# Patient Record
Sex: Female | Born: 1980 | Race: Black or African American | Hispanic: No | Marital: Single | Smoking: Current some day smoker
Health system: Southern US, Community
[De-identification: ages and names within clinical notes are randomized; demographics above are authoritative.]

## PROBLEM LIST (undated history)

## (undated) DIAGNOSIS — D649 Anemia, unspecified: Secondary | ICD-10-CM

## (undated) DIAGNOSIS — D259 Leiomyoma of uterus, unspecified: Secondary | ICD-10-CM

## (undated) DIAGNOSIS — F419 Anxiety disorder, unspecified: Secondary | ICD-10-CM

## (undated) DIAGNOSIS — Z789 Other specified health status: Secondary | ICD-10-CM

## (undated) DIAGNOSIS — F32A Depression, unspecified: Secondary | ICD-10-CM

## (undated) DIAGNOSIS — E7800 Pure hypercholesterolemia, unspecified: Secondary | ICD-10-CM

## (undated) HISTORY — PX: TOOTH EXTRACTION: SUR596

## (undated) HISTORY — DX: Depression, unspecified: F32.A

## (undated) HISTORY — PX: INDUCED ABORTION: SHX677

## (undated) HISTORY — PX: DILATION AND CURETTAGE OF UTERUS: SHX78

## (undated) HISTORY — DX: Anxiety disorder, unspecified: F41.9

## (undated) HISTORY — PX: TUBAL LIGATION: SHX77

## (undated) HISTORY — DX: Pure hypercholesterolemia, unspecified: E78.00

## (undated) HISTORY — DX: Leiomyoma of uterus, unspecified: D25.9

---

## 1998-03-26 ENCOUNTER — Emergency Department (HOSPITAL_COMMUNITY): Admission: EM | Admit: 1998-03-26 | Discharge: 1998-03-26 | Payer: Self-pay | Admitting: Emergency Medicine

## 1998-06-15 ENCOUNTER — Emergency Department (HOSPITAL_COMMUNITY): Admission: EM | Admit: 1998-06-15 | Discharge: 1998-06-15 | Payer: Self-pay | Admitting: Internal Medicine

## 1999-02-08 ENCOUNTER — Other Ambulatory Visit: Admission: RE | Admit: 1999-02-08 | Discharge: 1999-02-08 | Payer: Self-pay | Admitting: *Deleted

## 1999-09-07 ENCOUNTER — Emergency Department (HOSPITAL_COMMUNITY): Admission: EM | Admit: 1999-09-07 | Discharge: 1999-09-07 | Payer: Self-pay | Admitting: Emergency Medicine

## 2001-07-02 ENCOUNTER — Emergency Department (HOSPITAL_COMMUNITY): Admission: EM | Admit: 2001-07-02 | Discharge: 2001-07-02 | Payer: Self-pay | Admitting: Emergency Medicine

## 2002-06-05 ENCOUNTER — Emergency Department (HOSPITAL_COMMUNITY): Admission: EM | Admit: 2002-06-05 | Discharge: 2002-06-05 | Payer: Self-pay | Admitting: Emergency Medicine

## 2002-12-15 ENCOUNTER — Emergency Department (HOSPITAL_COMMUNITY): Admission: EM | Admit: 2002-12-15 | Discharge: 2002-12-15 | Payer: Self-pay | Admitting: Emergency Medicine

## 2005-03-17 ENCOUNTER — Emergency Department (HOSPITAL_COMMUNITY): Admission: EM | Admit: 2005-03-17 | Discharge: 2005-03-17 | Payer: Self-pay | Admitting: Family Medicine

## 2005-04-15 ENCOUNTER — Emergency Department (HOSPITAL_COMMUNITY): Admission: EM | Admit: 2005-04-15 | Discharge: 2005-04-15 | Payer: Self-pay | Admitting: Emergency Medicine

## 2005-06-28 ENCOUNTER — Emergency Department (HOSPITAL_COMMUNITY): Admission: EM | Admit: 2005-06-28 | Discharge: 2005-06-28 | Payer: Self-pay | Admitting: Emergency Medicine

## 2005-12-24 ENCOUNTER — Emergency Department (HOSPITAL_COMMUNITY): Admission: EM | Admit: 2005-12-24 | Discharge: 2005-12-24 | Payer: Self-pay | Admitting: Emergency Medicine

## 2005-12-25 ENCOUNTER — Emergency Department (HOSPITAL_COMMUNITY): Admission: EM | Admit: 2005-12-25 | Discharge: 2005-12-25 | Payer: Self-pay | Admitting: Emergency Medicine

## 2006-02-02 ENCOUNTER — Ambulatory Visit: Payer: Self-pay | Admitting: Family Medicine

## 2006-02-09 ENCOUNTER — Ambulatory Visit: Payer: Self-pay | Admitting: Family Medicine

## 2006-02-09 ENCOUNTER — Other Ambulatory Visit: Admission: RE | Admit: 2006-02-09 | Discharge: 2006-02-09 | Payer: Self-pay | Admitting: Family Medicine

## 2006-02-09 ENCOUNTER — Encounter (INDEPENDENT_AMBULATORY_CARE_PROVIDER_SITE_OTHER): Payer: Self-pay | Admitting: Specialist

## 2006-03-09 ENCOUNTER — Ambulatory Visit: Payer: Self-pay | Admitting: Obstetrics & Gynecology

## 2006-04-25 ENCOUNTER — Emergency Department (HOSPITAL_COMMUNITY): Admission: EM | Admit: 2006-04-25 | Discharge: 2006-04-25 | Payer: Self-pay | Admitting: Family Medicine

## 2007-02-23 ENCOUNTER — Inpatient Hospital Stay (HOSPITAL_COMMUNITY): Admission: AD | Admit: 2007-02-23 | Discharge: 2007-02-23 | Payer: Self-pay | Admitting: Obstetrics

## 2007-02-27 ENCOUNTER — Encounter: Payer: Self-pay | Admitting: Obstetrics

## 2007-02-27 ENCOUNTER — Ambulatory Visit (HOSPITAL_COMMUNITY): Admission: AD | Admit: 2007-02-27 | Discharge: 2007-02-27 | Payer: Self-pay | Admitting: Obstetrics

## 2007-06-20 ENCOUNTER — Emergency Department (HOSPITAL_COMMUNITY): Admission: EM | Admit: 2007-06-20 | Discharge: 2007-06-20 | Payer: Self-pay | Admitting: Emergency Medicine

## 2008-03-12 ENCOUNTER — Emergency Department (HOSPITAL_COMMUNITY): Admission: EM | Admit: 2008-03-12 | Discharge: 2008-03-12 | Payer: Self-pay | Admitting: Emergency Medicine

## 2009-12-16 ENCOUNTER — Inpatient Hospital Stay (HOSPITAL_COMMUNITY): Admission: AD | Admit: 2009-12-16 | Discharge: 2009-12-18 | Payer: Self-pay | Admitting: Obstetrics and Gynecology

## 2010-01-26 ENCOUNTER — Ambulatory Visit (HOSPITAL_COMMUNITY): Admission: RE | Admit: 2010-01-26 | Discharge: 2010-01-26 | Payer: Self-pay | Admitting: Obstetrics and Gynecology

## 2010-12-21 LAB — CBC
Hemoglobin: 11.7 g/dL — ABNORMAL LOW (ref 12.0–15.0)
MCV: 91.8 fL (ref 78.0–100.0)
Platelets: 245 10*3/uL (ref 150–400)
WBC: 5 10*3/uL (ref 4.0–10.5)

## 2010-12-26 LAB — CBC
HCT: 27.7 % — ABNORMAL LOW (ref 36.0–46.0)
HCT: 30 % — ABNORMAL LOW (ref 36.0–46.0)
Hemoglobin: 10 g/dL — ABNORMAL LOW (ref 12.0–15.0)
MCHC: 33.5 g/dL (ref 30.0–36.0)
MCV: 91.4 fL (ref 78.0–100.0)
MCV: 91.9 fL (ref 78.0–100.0)
Platelets: 261 10*3/uL (ref 150–400)
RBC: 3.28 MIL/uL — ABNORMAL LOW (ref 3.87–5.11)
RDW: 14.7 % (ref 11.5–15.5)
WBC: 12.1 10*3/uL — ABNORMAL HIGH (ref 4.0–10.5)
WBC: 13.5 10*3/uL — ABNORMAL HIGH (ref 4.0–10.5)

## 2010-12-26 LAB — RPR: RPR Ser Ql: NONREACTIVE

## 2011-01-03 ENCOUNTER — Inpatient Hospital Stay (HOSPITAL_COMMUNITY)
Admission: AD | Admit: 2011-01-03 | Discharge: 2011-01-03 | Disposition: A | Discharge status: Home / Self Care | Payer: Medicaid Other | Source: Ambulatory Visit | Attending: Obstetrics and Gynecology | Admitting: Obstetrics and Gynecology

## 2011-01-03 DIAGNOSIS — N949 Unspecified condition associated with female genital organs and menstrual cycle: Secondary | ICD-10-CM | POA: Insufficient documentation

## 2011-01-03 DIAGNOSIS — N841 Polyp of cervix uteri: Secondary | ICD-10-CM | POA: Insufficient documentation

## 2011-01-03 DIAGNOSIS — N938 Other specified abnormal uterine and vaginal bleeding: Secondary | ICD-10-CM | POA: Insufficient documentation

## 2011-01-03 LAB — WET PREP, GENITAL: Yeast Wet Prep HPF POC: NONE SEEN

## 2011-01-03 LAB — POCT PREGNANCY, URINE: Preg Test, Ur: NEGATIVE

## 2011-01-13 ENCOUNTER — Encounter (INDEPENDENT_AMBULATORY_CARE_PROVIDER_SITE_OTHER): Payer: Medicaid Other | Admitting: Obstetrics & Gynecology

## 2011-01-13 ENCOUNTER — Other Ambulatory Visit: Payer: Self-pay | Admitting: Obstetrics and Gynecology

## 2011-01-13 DIAGNOSIS — N86 Erosion and ectropion of cervix uteri: Secondary | ICD-10-CM

## 2011-01-13 DIAGNOSIS — Z124 Encounter for screening for malignant neoplasm of cervix: Secondary | ICD-10-CM

## 2011-01-13 DIAGNOSIS — Z01419 Encounter for gynecological examination (general) (routine) without abnormal findings: Secondary | ICD-10-CM

## 2011-01-14 NOTE — Progress Notes (Signed)
NAME:  Kathleen Levy, Kathleen Levy NO.:  [id number]  MEDICAL RECORD NO.:  [id number]           PATIENT TYPE:  A  LOCATION:  [facility]                   FACILITY:  [facility]  PHYSICIAN:  Argentina Donovan, MD        DATE OF BIRTH:  11/22/1980  DATE OF SERVICE:  01/13/2011                                 CLINIC NOTE  The patient is a 30 year old African American female, gravida 4, para 2- 0-2-2, who had been quite some time without having had sex.  She had a recent companion and began spotting and bleeding after sex.  She went into the emergency room and they told her she has a small cervical polyp and referred her to here.  On examination, the patient had a Pap in a year.  We did a Pap, which she had a slight cervical ectropion that almost look like a polyp, which we treated with silver nitrate quite easily and I think that will take care of her problem.  The external genitalia is normal.  BUS within normal limits.  Vagina is clean and well rugated.  The cervix is clean and parous.  The uterus is anterior, normal size, shape, and consistency.  The adnexa is normal.  IMPRESSION:  Normal gynecological examination with small ectropion, treated with silver nitrate.          ______________________________ Argentina Donovan, MD    PR/MEDQ  D:  01/13/2011  T:  01/14/2011  Job:  [id number]

## 2011-02-15 NOTE — Op Note (Signed)
Kathleen Levy, Kathleen Levy NO.:  [id number]   MEDICAL RECORD NO.:  [id number]          PATIENT TYPE:  MAT   LOCATION:  [facility]                          [facility]   PHYSICIAN:  Charles A. Clearance Coots, M.D.DATE OF BIRTH:  July 13, 1981   DATE OF PROCEDURE:  DATE OF DISCHARGE:                               OPERATIVE REPORT   PREOPERATIVE DIAGNOSIS:  Incomplete spontaneous abortion first  trimester.   POSTOPERATIVE DIAGNOSIS:  Incomplete spontaneous abortion first  trimester.   PROCEDURE:  Suction dilation and evacuation.   SURGEON:  Dr. Coral Ceo.   ANESTHESIA:  MAC with paracervical block.   ESTIMATED BLOOD LOSS:  100 mL.   COMPLICATIONS:  None.   SPECIMEN:  Products of conception.   OPERATION:  The patient was brought to operating room, and after  satisfactory IV sedation, the legs were brought up in stirrups and the  vagina was prepped and draped in usual sterile fashion.  The urinary  bladder was emptied of approximately 50 mL of clear urine.  Bimanual  examination revealed uterus to be retroverted, about eight week size.  Cervix was opened with products of conception in the cervical os.  Sterile speculum was inserted in the vaginal vault and cervix was  isolated.  The anterior lip of the cervix was grasped with a single  tooth tenaculum.  Paracervical block of 10 mL of 1% lidocaine with a  1:10 dilution of sodium bicarbonate, 10 mL was injected in each lateral  fornix.  The uterus was sounded for its axis, and the cervix is dilated  to approximately 27 mm with the [facility] dilator. A #8 suction catheter was  easily introduced into the uterine cavity, and all contents were  removed.  The endometrial surface felt gritty at the conclusion of the  procedure, uterus contracted down quite well.  There was no active  bleeding.  At the conclusion of the procedure, products of conception  were submitted to pathology for evaluation.  The patient tolerated the  procedure well and was transported to recovery room in satisfactory  condition.      Charles A. Clearance Coots, M.D.  Electronically Signed     CAH/MEDQ  D:  02/27/2007  T:  02/27/2007  Job:  [id number]

## 2011-02-18 NOTE — Group Therapy Note (Signed)
[name], Kathleen Levy NO.:  [id number]   MEDICAL RECORD NO.:  [id number]          PATIENT TYPE:  WOC   LOCATION:  [facility]                   FACILITY:  [facility]   PHYSICIAN:  Tinnie Gens, MD        DATE OF BIRTH:  12/16/1980   DATE OF SERVICE:  02/02/2006                                    CLINIC NOTE   CHIEF COMPLAINT:  Abnormal Pap.   HISTORY OF PRESENT ILLNESS:  The patient is a 30 year old, gravida 2, para 1-  0-1-1 who [facility] that showed  ASC-H.  After that she underwent a colposcopy, which showed mild  koilocytotic atypia and failed to show a complete agreement with her Pap  smear.  Additionally, the patient is status post cryo in 1997 and had an  unsatisfactory Pap smear.   PAST MEDICAL HISTORY:  Significant for a heart murmur.   PAST SURGICAL HISTORY:  Negative.   MEDICATIONS:  Vicodin as needed secondary to having a tooth pulled.   ALLERGIES:  None known.   OB HISTORY:  G 2, P 1, one termination, one living child by vaginal  delivery.   GYN HISTORY:  Menarche at age 21.  Cycles every 28 days, lasts 4 days, medium  flow, moderate pain.  The patient is not on any birth control.  She is in a  relationship with a female partner.   FAMILY HISTORY:  Significant for diabetes and hypertension.   SOCIAL HISTORY:  She lives with her daughter.  She does work.  She drinks  alcohol socially.  No other drug use.  She does not smoke.   A 14-point review of systems was reviewed and negative.  Please see GYN  history in the chart.   PHYSICAL EXAMINATION:  VITAL SIGNS:  Her vitals are on the chart.  GENERAL APPEARANCE:  She is a well-developed, well-nourished African-  American female, in no acute distress.  ABDOMEN:  Soft, nontender, nondistended.   IMPRESSION:  1.  Abnormal Pap.  2.  A history of cryo.  3.  Unsatisfactory colposcopy.   PLAN:  __________  She is scheduled back on May 10 for this.     ______________________________  Tinnie Gens, MD     TP/MEDQ  D:  02/02/2006  T:  02/03/2006  Job:  [id number]

## 2011-02-18 NOTE — Group Therapy Note (Signed)
NAME:  Kathleen Levy, Kathleen Levy NO.:  [id number]   MEDICAL RECORD NO.:  [id number]          PATIENT TYPE:  WOC   LOCATION:  [facility]                   FACILITY:  [facility]   PHYSICIAN:  Tinnie Gens, MD        DATE OF BIRTH:  October 06, 1980   DATE OF SERVICE:                                    CLINIC NOTE   CHIEF COMPLAINT:  Annual Pap smear.   HISTORY:  This patient is a 30 year old female who has had previous cryo who  had ASC-H and then a benign colpo biopsy with an unsatisfactory colpo who is  referred for LEEP procedure.   PROCEDURE:  A Teflon __________ was placed inside the vagina.  A colposcopy  was performed.  There was acetowhite area around a very small transformation  zone.  Edges of the lesions could not be seen.  Lidocaine 1% 10 mL with  epinephrine was then injected for a paracervical block, and a small  __________ cone was used to perform the LEEP.  The LEEP was cauterized with  electrocautery.  Hemostasis was achieved with Monsell's solution solution.  The patient tolerated the procedure well.  She will follow up in two weeks  for a review of pathology.           ______________________________  Tinnie Gens, MD     TP/MEDQ  D:  02/09/2006  T:  02/10/2006  Job:  [id number]

## 2011-02-18 NOTE — Group Therapy Note (Signed)
NAME:  Kathleen Levy, Kathleen Levy NO.:  [id number]   MEDICAL RECORD NO.:  [id number]          PATIENT TYPE:  WOC   LOCATION:  [facility]                   [facility]ACILITY:  [facility]   PHYSICIAN:  Dorthula Perfect, MD     DATE OF BIRTH:  Aug 01, 1981   DATE OF SERVICE:  03/09/2006                                    CLINIC NOTE   HISTORY OF PRESENT ILLNESS:  This 30 year old African American female had a  LEEP procedure done Feb 10, 2006 because of an abnormal Pap smear.  She had  directed colposcopy followed by biopsy.  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  The margins were free of dysplasia.   Her last menstrual period was the end of May.  She is having no vaginal  bleeding.  She has a slight amount of vaginal discharge, but it is lessening  every day or so.   PHYSICAL EXAMINATION:  PELVIC:  Her external genitalia and BUS glands were  normal.  Vaginal vault was epithelialized.  Inspection of the cervix  revealed a healing cervix post-LEEP.  There was a minute bleeding site at  the 6 o'clock position which was treated with silver nitrate.  The uterus  was normal.   IMPRESSION:  Status post CIN1 loop electrosurgical excision procedure.   DISPOSITION:  The patient is asked to refrain from any sexual intercourse  for the next month or so.  She states she is not having intercourse at this  time.  She is encouraged to have another Pap smear performed in four months.  She has no questions.           ______________________________  Dorthula Perfect, MD     ER/MEDQ  D:  03/09/2006  T:  03/10/2006  Job:  [id number]

## 2011-03-28 ENCOUNTER — Inpatient Hospital Stay (INDEPENDENT_AMBULATORY_CARE_PROVIDER_SITE_OTHER)
Admission: RE | Admit: 2011-03-28 | Discharge: 2011-03-28 | Disposition: A | Discharge status: Home / Self Care | Payer: Medicaid Other | Source: Ambulatory Visit | Attending: Family Medicine | Admitting: Family Medicine

## 2011-03-28 DIAGNOSIS — M545 Low back pain: Secondary | ICD-10-CM

## 2011-03-28 LAB — POCT URINALYSIS DIP (DEVICE)
Glucose, UA: NEGATIVE mg/dL
Leukocytes, UA: NEGATIVE
Urobilinogen, UA: 0.2 mg/dL (ref 0.0–1.0)

## 2011-11-03 ENCOUNTER — Encounter (HOSPITAL_COMMUNITY): Payer: Self-pay | Admitting: *Deleted

## 2011-11-03 ENCOUNTER — Inpatient Hospital Stay (HOSPITAL_COMMUNITY)
Admission: AD | Admit: 2011-11-03 | Discharge: 2011-11-04 | Disposition: A | Discharge status: Home / Self Care | Payer: Medicaid Other | Source: Ambulatory Visit | Attending: Obstetrics and Gynecology | Admitting: Obstetrics and Gynecology

## 2011-11-03 ENCOUNTER — Emergency Department (HOSPITAL_COMMUNITY)
Admission: EM | Admit: 2011-11-03 | Discharge: 2011-11-03 | Disposition: A | Discharge status: Home / Self Care | Payer: Medicaid Other | Source: Home / Self Care | Attending: Emergency Medicine | Admitting: Emergency Medicine

## 2011-11-03 DIAGNOSIS — J029 Acute pharyngitis, unspecified: Secondary | ICD-10-CM

## 2011-11-03 DIAGNOSIS — H9209 Otalgia, unspecified ear: Secondary | ICD-10-CM | POA: Insufficient documentation

## 2011-11-03 DIAGNOSIS — A499 Bacterial infection, unspecified: Secondary | ICD-10-CM | POA: Insufficient documentation

## 2011-11-03 DIAGNOSIS — N949 Unspecified condition associated with female genital organs and menstrual cycle: Secondary | ICD-10-CM | POA: Insufficient documentation

## 2011-11-03 DIAGNOSIS — B9689 Other specified bacterial agents as the cause of diseases classified elsewhere: Secondary | ICD-10-CM | POA: Insufficient documentation

## 2011-11-03 DIAGNOSIS — N760 Acute vaginitis: Secondary | ICD-10-CM | POA: Insufficient documentation

## 2011-11-03 HISTORY — DX: Other specified health status: Z78.9

## 2011-11-03 LAB — URINALYSIS, ROUTINE W REFLEX MICROSCOPIC
Bilirubin Urine: NEGATIVE
Glucose, UA: NEGATIVE mg/dL
Nitrite: NEGATIVE
Specific Gravity, Urine: >1.03 — ABNORMAL HIGH (ref 1.005–1.030)
pH: 5.5 (ref 5.0–8.0)

## 2011-11-03 LAB — WET PREP, GENITAL
Trich, Wet Prep: NONE SEEN
Yeast Wet Prep HPF POC: NONE SEEN

## 2011-11-03 LAB — URINE MICROSCOPIC-ADD ON

## 2011-11-03 MED ORDER — IBUPROFEN 800 MG PO TABS
800.0000 mg | ORAL_TABLET | Freq: Once | ORAL | Status: AC
  Administered 2011-11-03: 800 mg via ORAL
  Filled 2011-11-03: qty 1

## 2011-11-03 NOTE — ED Provider Notes (Signed)
History     CSN: [id number]  Arrival date & time 11/03/11  4098   First MD Initiated Contact with Patient 11/03/11 1946      Chief Complaint  Patient presents with  . Otalgia    with sore throat    (Consider location/radiation/quality/duration/timing/severity/associated sxs/prior treatment) HPI  Patient presents to ER complaining of a 24 hour hx of gradual onset of sore throat and right ear pain that began yesterday and persisted without change throughout the day today. Patient has not taken anything for pain PTA. 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 Denies fevers, chills, HA, stiffneck, rash, cough, CP, SOB, n/v. Patient has no known medical problems and takes no meds on regular basis. She states swallowing increases pain. Denies alleviating factors. Denies changes in hearing.   History reviewed. No pertinent past medical history.  Past Surgical History  Procedure Date  . Tubal ligation     History reviewed. No pertinent family history.  History  Substance Use Topics  . Smoking status: Not on file  . Smokeless tobacco: Not on file  . Alcohol Use: No    OB History    Grav Para Term Preterm Abortions TAB SAB Ect Mult Living                  Review of Systems  All other systems reviewed and are negative.    Allergies  Review of patient's allergies indicates no known allergies.  Home Medications  No current outpatient prescriptions on file.  BP 113/89  Pulse 74  Temp(Src) 98.3 F (36.8 C) (Oral)  Resp 18  SpO2 99%  Physical Exam  Vitals reviewed. Constitutional: She is oriented to person, place, and time. She appears well-developed and well-nourished. No distress.  HENT:  Head: Normocephalic and atraumatic.  Right Ear: Tympanic membrane and external ear normal.  Left Ear: Tympanic membrane and external ear normal.  Nose: Nose normal.  Mouth/Throat: No oropharyngeal exudate.       Mild erythema of posterior pharynx and tonsils no tonsillar exudate  or enlargement. Patent airway. Swallowing secretions well  Drainage of posterior pharynx.   Eyes: Conjunctivae and EOM are normal. Pupils are equal, round, and reactive to light.  Neck: Normal range of motion. Neck supple.  Cardiovascular: Normal rate, regular rhythm and normal heart sounds.  Exam reveals no gallop and no friction rub.   No murmur heard. Pulmonary/Chest: Effort normal and breath sounds normal. No respiratory distress. She has no wheezes. She has no rales. She exhibits no tenderness.  Abdominal: Soft. She exhibits no distension and no mass. There is no tenderness. There is no rebound and no guarding.  Lymphadenopathy:    She has no cervical adenopathy.  Neurological: She is alert and oriented to person, place, and time. She has normal reflexes.  Skin: Skin is warm and dry. No rash noted. She is not diaphoretic.  Psychiatric: She has a normal mood and affect.    ED Course  Procedures (including critical care time)  PO ibuprofen.   Labs Reviewed - No data to display No results found.   1. Pharyngitis       MDM  Afebrile, non toxic appearing, swallowing secretions well. Patent airway. No signs or symptoms of tonsilar abscess. Normal TM's bilaterally.         Jenness Corner, [place] 11/03/11 [phone]

## 2011-11-03 NOTE — Progress Notes (Signed)
Pt having lower abd pain and yellow discharge x 1 wk.  Hx BAV.  LMP 1/21

## 2011-11-03 NOTE — ED Notes (Signed)
S. Lineberry NP at the bedside. 

## 2011-11-03 NOTE — ED Notes (Signed)
Pt started having a sore throat and right ear ache yesterday.

## 2011-11-03 NOTE — ED Provider Notes (Signed)
Medical screening examination/treatment/procedure(s) were performed by non-physician practitioner and as supervising physician I was immediately available for consultation/collaboration.  Arrin Ishler P Lockie Bothun, MD 11/03/11 2331 

## 2011-11-03 NOTE — ED Provider Notes (Signed)
History     Chief Complaint  Patient presents with  . Vaginal Discharge  . Abdominal Pain   HPI  Pt is not pregnant and complains of vaginal discharge with mild pelvic pain.  Pt has a history of bacterial vaginosis No past medical history on file.  Past Surgical History  Procedure Date  . Tubal ligation     No family history on file.  History  Substance Use Topics  . Smoking status: Not on file  . Smokeless tobacco: Not on file  . Alcohol Use: No    Allergies: No Known Allergies  No prescriptions prior to admission    Review of Systems  Constitutional: Negative for fever and chills.  Gastrointestinal: Positive for abdominal pain. Negative for nausea, vomiting, diarrhea and constipation.  Genitourinary: Negative for dysuria.  Skin: Negative for rash.   Physical Exam   Blood pressure 114/79, temperature 98.1 F (36.7 C), temperature source Oral, resp. rate 16, height 5\' 3"  (1.6 m), weight 202 lb (91.627 kg), last menstrual period 10/24/2011, SpO2 98.00%.  Physical Exam  Constitutional: She is oriented to person, place, and time. She appears well-developed and well-nourished.  HENT:  Head: Normocephalic.  Eyes: Pupils are equal, round, and reactive to light.  Neck: Normal range of motion. Neck supple.  Cardiovascular: Normal rate.   Respiratory: Effort normal.  GI: Soft. She exhibits no distension. There is no tenderness. There is no rebound and no guarding.  Genitourinary:       Small amount of white vaginal discharge in vault; cervix clean nontender uterus NSSC nontender adnexal without palpable enlargement or tenderness  Musculoskeletal: Normal range of motion.  Neurological: She is alert and oriented to person, place, and time.  Skin: Skin is warm and dry.  Psychiatric: She has a normal mood and affect.    MAU Course  Procedures Results for orders placed during the hospital encounter of 11/03/11 (from the past 24 hour(s))  URINALYSIS, ROUTINE W REFLEX  MICROSCOPIC     Status: Abnormal   Collection Time   11/03/11  8:45 PM      Component Value Range   Color, Urine YELLOW  YELLOW    APPearance CLEAR  CLEAR    Specific Gravity, Urine >1.030 (*) 1.005 - 1.030    pH 5.5  5.0 - 8.0    Glucose, UA NEGATIVE  NEGATIVE (mg/dL)   Hgb urine dipstick SMALL (*) NEGATIVE    Bilirubin Urine NEGATIVE  NEGATIVE    Ketones, ur NEGATIVE  NEGATIVE (mg/dL)   Protein, ur NEGATIVE  NEGATIVE (mg/dL)   Urobilinogen, UA 0.2  0.0 - 1.0 (mg/dL)   Nitrite NEGATIVE  NEGATIVE    Leukocytes, UA NEGATIVE  NEGATIVE   URINE MICROSCOPIC-ADD ON     Status: Abnormal   Collection Time   11/03/11  8:45 PM      Component Value Range   Squamous Epithelial / LPF RARE  RARE    WBC, UA 0-2  <3 (WBC/hpf)   RBC / HPF 3-6  <3 (RBC/hpf)   Bacteria, UA FEW (*) RARE   WET PREP, GENITAL     Status: Abnormal   Collection Time   11/03/11 11:15 PM      Component Value Range   Yeast Wet Prep HPF POC NONE SEEN  NONE SEEN    Trich, Wet Prep NONE SEEN  NONE SEEN    Clue Cells Wet Prep HPF POC FEW (*) NONE SEEN    WBC, Wet Prep HPF POC FEW (*)  NONE SEEN     Assessment and Plan  Bacterial vaginosis Prescription for Flagyl 500 mg BID for 7 days  Anneka Studer 11/03/2011, 11:03 PM

## 2011-11-04 LAB — GC/CHLAMYDIA PROBE AMP, GENITAL
Chlamydia, DNA Probe: NEGATIVE
GC Probe Amp, Genital: NEGATIVE

## 2011-11-04 MED ORDER — METRONIDAZOLE 500 MG PO TABS: 500.0000 mg | ORAL_TABLET | Freq: Two times a day (BID) | ORAL | Status: AC

## 2011-11-04 NOTE — ED Provider Notes (Signed)
Agree with above note.  Kathleen Levy 11/04/2011 5:50 AM

## 2012-04-24 ENCOUNTER — Encounter (HOSPITAL_COMMUNITY): Payer: Self-pay

## 2012-04-24 ENCOUNTER — Emergency Department (INDEPENDENT_AMBULATORY_CARE_PROVIDER_SITE_OTHER)
Admission: EM | Admit: 2012-04-24 | Discharge: 2012-04-24 | Disposition: A | Discharge status: Home / Self Care | Payer: Self-pay | Source: Home / Self Care

## 2012-04-24 DIAGNOSIS — B372 Candidiasis of skin and nail: Secondary | ICD-10-CM

## 2012-04-24 DIAGNOSIS — W57XXXA Bitten or stung by nonvenomous insect and other nonvenomous arthropods, initial encounter: Secondary | ICD-10-CM

## 2012-04-24 DIAGNOSIS — R202 Paresthesia of skin: Secondary | ICD-10-CM

## 2012-04-24 MED ORDER — CLOTRIMAZOLE 1 % EX CREA: TOPICAL_CREAM | CUTANEOUS | Status: AC

## 2012-04-24 MED ORDER — IBUPROFEN 800 MG PO TABS: 800.0000 mg | ORAL_TABLET | Freq: Three times a day (TID) | ORAL | Status: AC

## 2012-04-24 NOTE — ED Notes (Signed)
Patient states she may have been bitten by something on her left hand yesterday; c/o pain and swelling

## 2012-04-24 NOTE — ED Provider Notes (Signed)
History     CSN: [id number]  Arrival date & time 04/24/12  1849   None     Chief Complaint  Patient presents with  . Insect Bite    (Consider location/radiation/quality/duration/timing/severity/associated sxs/prior treatment) The history is provided by the patient.   This patient complains of a pruritic rash.  Location: bilateral breast folds Onset: 2 wk ago   Course: somewhat impoved Self-treated with: OTc powder             Improvement with treatment: yes  History Itching: yes  Tenderness: no  New medications/antibiotics: no  Pet exposure: no  Recent travel or tropical exposure: no  New soaps, shampoos, detergent, clothing: no Tick/insect exposure: no   Red Flags Feeling ill: no Fever:no Facial/tongue swelling/difficulty breathing:  no  Diabetic or immunocompromised: no Reports occurs each summer, also c/o left 3rd finger swelling after unknown insect bite to left dorsal hand associated with intermittent tingling.  No change in hand function or neurovascular status.  Past Medical History  Diagnosis Date  . No pertinent past medical history     Past Surgical History  Procedure Date  . Tubal ligation     History reviewed. No pertinent family history.  History  Substance Use Topics  . Smoking status: Never Smoker   . Smokeless tobacco: Not on file  . Alcohol Use: No    OB History    Grav Para Term Preterm Abortions TAB SAB Ect Mult Living   4 2 2  2 1 1   2       Review of Systems  Allergies  Review of patient's allergies indicates no known allergies.  Home Medications   Current Outpatient Rx  Name Route Sig Dispense Refill  . CLOTRIMAZOLE 1 % EX CREA  Apply to affected area 2 times daily 15 g 0  . DIPHENHYDRAMINE HCL 25 MG PO TABS Oral Take 25 mg by mouth every 6 (six) hours as needed. Patient only takes this as needed no set schedule    . IBUPROFEN 800 MG PO TABS Oral Take 1 tablet (800 mg total) by mouth 3 (three) times daily. 21 tablet 0      BP 103/68  Pulse 81  Temp 99 F (37.2 C) (Oral)  Resp 16  SpO2 97%  Physical Exam  Nursing note and vitals reviewed. Constitutional: She is oriented to person, place, and time. Vital signs are normal. She appears well-developed and well-nourished. She is active and cooperative.  HENT:  Head: Normocephalic.  Eyes: Conjunctivae are normal. Pupils are equal, round, and reactive to light. No scleral icterus.  Neck: Trachea normal. Neck supple.  Cardiovascular: Normal rate and regular rhythm.   Pulmonary/Chest: Effort normal and breath sounds normal.  Musculoskeletal:       Left shoulder: Normal.       Left elbow: Normal.       Left wrist: Normal.       Cervical back: Normal.       Left hand: Normal. She exhibits normal range of motion, no tenderness, normal two-point discrimination, normal capillary refill and no swelling. normal sensation noted. Normal strength noted.  Neurological: She is alert and oriented to person, place, and time. She has normal strength. No cranial nerve deficit or sensory deficit. GCS eye subscore is 4. GCS verbal subscore is 5. GCS motor subscore is 6.  Skin: Skin is warm and dry. Lesion noted.       Two scabbed lesion to inferior and superior to left 3rd phalanx  and metacarpal joint  Psychiatric: She has a normal mood and affect. Her speech is normal and behavior is normal. Judgment and thought content normal. Cognition and memory are normal.    ED Course  Procedures (including critical care time)  Labs Reviewed - No data to display No results found.   1. Candidal intertrigo   2. Paresthesia   3. Insect bite       MDM  Fungal cream as ordered, keep area dry.  Ibuprofen for hand pain, if not improved RTc for further evaluation.       Johnsie Kindred, NP 04/24/12 2130

## 2012-04-25 NOTE — ED Provider Notes (Signed)
Medical screening examination/treatment/procedure(s) were performed by non-physician practitioner and as supervising physician I was immediately available for consultation/collaboration.  Leslee Home, M.D.   Reuben Likes, MD 04/25/12 2055

## 2013-07-20 ENCOUNTER — Inpatient Hospital Stay (HOSPITAL_COMMUNITY)
Admission: AD | Admit: 2013-07-20 | Discharge: 2013-07-20 | Disposition: A | Discharge status: Home / Self Care | Payer: Self-pay | Source: Ambulatory Visit | Attending: Obstetrics and Gynecology | Admitting: Obstetrics and Gynecology

## 2013-07-20 ENCOUNTER — Encounter (HOSPITAL_COMMUNITY): Payer: Self-pay | Admitting: *Deleted

## 2013-07-20 DIAGNOSIS — N949 Unspecified condition associated with female genital organs and menstrual cycle: Secondary | ICD-10-CM | POA: Insufficient documentation

## 2013-07-20 DIAGNOSIS — N760 Acute vaginitis: Secondary | ICD-10-CM

## 2013-07-20 DIAGNOSIS — B9689 Other specified bacterial agents as the cause of diseases classified elsewhere: Secondary | ICD-10-CM

## 2013-07-20 DIAGNOSIS — A499 Bacterial infection, unspecified: Secondary | ICD-10-CM | POA: Insufficient documentation

## 2013-07-20 LAB — GC/CHLAMYDIA PROBE AMP
CT Probe RNA: NEGATIVE
GC Probe RNA: NEGATIVE

## 2013-07-20 LAB — WET PREP, GENITAL: Yeast Wet Prep HPF POC: NONE SEEN

## 2013-07-20 MED ORDER — METRONIDAZOLE 500 MG PO TABS: 500.0000 mg | ORAL_TABLET | Freq: Two times a day (BID) | ORAL | Status: DC

## 2013-07-20 NOTE — MAU Provider Note (Signed)
Chief Complaint: Vaginal Discharge  First Provider Initiated Contact with Patient 07/20/13 0129     SUBJECTIVE HPI: Kathleen Levy is a 32 y.o. [id number] female who presents to MAU with yellow discharge x a few days. Denies fever. Chills, abd pain, itching , odor or pain, vaginal bleeding or pain w/ intercourse.   Patient's last menstrual period was 07/03/2013. Declines UPT.   Past Medical History  Diagnosis Date  . No pertinent past medical history    OB History  Gravida Para Term Preterm AB SAB TAB Ectopic Multiple Living  4 2 2  2 1 1   2     # Outcome Date GA Lbr Len/2nd Weight Sex Delivery Anes PTL Lv  4 TRM      SVD     3 TRM      SVD     2 TAB           1 SAB              Past Surgical History  Procedure Laterality Date  . Tubal ligation    . Dilation and curettage of uterus    . Induced abortion     History   Social History  . Marital Status: Single    Spouse Name: N/A    Number of Children: N/A  . Years of Education: N/A   Occupational History  . Not on file.   Social History Main Topics  . Smoking status: Never Smoker   . Smokeless tobacco: Not on file  . Alcohol Use: Yes     Comment: socially  . Drug Use: No  . Sexual Activity: Yes    Birth Control/ Protection: Surgical   Other Topics Concern  . Not on file   Social History Narrative  . No narrative on file   No current facility-administered medications on file prior to encounter.   Current Outpatient Prescriptions on File Prior to Encounter  Medication Sig Dispense Refill  . diphenhydrAMINE (BENADRYL) 25 MG tablet Take 25 mg by mouth every 6 (six) hours as needed. Patient only takes this as needed no set schedule       No Known Allergies  ROS: Pertinent items in HPI  OBJECTIVE Blood pressure 114/64, pulse 74, temperature 98.6 F (37 C), temperature source Oral, resp. rate 18, height 5\' 3"  (1.6 m), weight 82.555 kg (182 lb), last menstrual period 07/03/2013. GENERAL: Well-developed,  well-nourished female in no acute distress.  HEENT: Normocephalic HEART: normal rate RESP: normal effort ABDOMEN: Soft, non-tender EXTREMITIES: Nontender, no edema NEURO: Alert and oriented SPECULUM EXAM: NEFG, small amount of yellow-tinged, mildly malodorous discharge, no blood noted, cervix clean. BIMANUAL: cervix closed; uterus normal size, no adnexal tenderness or masses. No cervical motion tenderness.  LAB RESULTS Results for orders placed during the hospital encounter of 07/20/13 (from the past 24 hour(s))  WET PREP, GENITAL     Status: Abnormal   Collection Time    07/20/13  1:35 AM      Result Value Range   Yeast Wet Prep HPF POC NONE SEEN  NONE SEEN   Trich, Wet Prep NONE SEEN  NONE SEEN   Clue Cells Wet Prep HPF POC FEW (*) NONE SEEN   WBC, Wet Prep HPF POC FEW (*) NONE SEEN    IMAGING No results found.  MAU COURSE  ASSESSMENT 1. BV (bacterial vaginosis)     PLAN Discharge home in stable condition. List of gynecologists given. Follow-up Information   Follow up with Gynecologist. (  As needed if symptoms worsen)        Medication List         diphenhydrAMINE 25 MG tablet  Commonly known as:  BENADRYL  Take 25 mg by mouth every 6 (six) hours as needed. Patient only takes this as needed no set schedule     metroNIDAZOLE 500 MG tablet  Commonly known as:  FLAGYL  Take 1 tablet (500 mg total) by mouth 2 (two) times daily.       Dorathy Kinsman, CNM 07/20/2013  2:21 AM

## 2013-07-20 NOTE — MAU Note (Signed)
I'm having light yellow d/c which has been present for few days. Wandering if I have yeast infection. No itching

## 2013-07-20 NOTE — Progress Notes (Signed)
Written and verbal d/c instructions given and understanding voiced. 

## 2013-07-21 NOTE — MAU Provider Note (Signed)
Attestation of Attending Supervision of Advanced Practitioner: Evaluation and management procedures were performed by the PA/NP/CNM/OB Fellow under my supervision/collaboration. Chart reviewed and agree with management and plan.  Tammera Engert V 07/21/2013 11:46 PM   

## 2013-09-21 ENCOUNTER — Emergency Department (HOSPITAL_COMMUNITY)
Admission: EM | Admit: 2013-09-21 | Discharge: 2013-09-21 | Disposition: A | Discharge status: Home / Self Care | Payer: Self-pay | Attending: Emergency Medicine | Admitting: Emergency Medicine

## 2013-09-21 ENCOUNTER — Encounter (HOSPITAL_COMMUNITY): Payer: Self-pay | Admitting: Emergency Medicine

## 2013-09-21 ENCOUNTER — Emergency Department (HOSPITAL_COMMUNITY): Payer: Self-pay

## 2013-09-21 DIAGNOSIS — J3489 Other specified disorders of nose and nasal sinuses: Secondary | ICD-10-CM | POA: Insufficient documentation

## 2013-09-21 DIAGNOSIS — Z79899 Other long term (current) drug therapy: Secondary | ICD-10-CM | POA: Insufficient documentation

## 2013-09-21 DIAGNOSIS — R0602 Shortness of breath: Secondary | ICD-10-CM | POA: Insufficient documentation

## 2013-09-21 DIAGNOSIS — F411 Generalized anxiety disorder: Secondary | ICD-10-CM | POA: Insufficient documentation

## 2013-09-21 DIAGNOSIS — R079 Chest pain, unspecified: Secondary | ICD-10-CM

## 2013-09-21 DIAGNOSIS — F419 Anxiety disorder, unspecified: Secondary | ICD-10-CM

## 2013-09-21 DIAGNOSIS — R0789 Other chest pain: Secondary | ICD-10-CM | POA: Insufficient documentation

## 2013-09-21 LAB — CBC WITH DIFFERENTIAL/PLATELET
Basophils Absolute: 0 10*3/uL (ref 0.0–0.1)
Basophils Relative: 0 % (ref 0–1)
Eosinophils Relative: 1 % (ref 0–5)
Hemoglobin: 11.8 g/dL — ABNORMAL LOW (ref 12.0–15.0)
MCH: 29.9 pg (ref 26.0–34.0)
MCHC: 32.9 g/dL (ref 30.0–36.0)
MCV: 91.1 fL (ref 78.0–100.0)
Neutro Abs: 2.9 10*3/uL (ref 1.7–7.7)
Neutrophils Relative %: 51 % (ref 43–77)
Platelets: 311 10*3/uL (ref 150–400)
RDW: 13.1 % (ref 11.5–15.5)

## 2013-09-21 LAB — BASIC METABOLIC PANEL
Chloride: 100 mEq/L (ref 96–112)
GFR calc Af Amer: >90 mL/min (ref >90)
Potassium: 4.1 mEq/L (ref 3.5–5.1)
Sodium: 137 mEq/L (ref 135–145)

## 2013-09-21 LAB — TROPONIN I: Troponin I: <0.3 ng/mL (ref <0.30)

## 2013-09-21 LAB — D-DIMER, QUANTITATIVE: D-Dimer, Quant: 0.4 ug/mL-FEU (ref 0.00–0.48)

## 2013-09-21 NOTE — ED Notes (Signed)
The pt  Reports that she had a panic attack last pm after smoking pot.  She called 911 and was told that she had a panic attack over the phone.  She had some chest discomfort during the attack.  Today she just feels strange no longer feeling panicky.  She never had these symptoms previously.

## 2013-09-21 NOTE — ED Provider Notes (Signed)
CSN: [id number]     Arrival date & time 09/21/13  1653 History   First MD Initiated Contact with Patient 09/21/13 1713     Chief Complaint  Patient presents with  . Panic Attack   (Consider location/radiation/quality/duration/timing/severity/associated sxs/prior Treatment) The history is provided by the patient.   32 year old female patient was using marijuana last evening and then suddenly got a panic attack she thought she felt short of breath had chest discomfort. 911 was called they were evaluated by them and deemed safe at that time. Patient no longer has the chest pain Celestine shortness of breath is still feeling anxious no true panic attack currently. Patient is not sure why she still feeling this way. Patient had trouble sleeping last night. Patient denies any abdominal pain any fevers any rash any history of similar response to marijuana.  Past Medical History  Diagnosis Date  . No pertinent past medical history    Past Surgical History  Procedure Laterality Date  . Tubal ligation    . Dilation and curettage of uterus    . Induced abortion     Family History  Problem Relation Age of Onset  . Diabetes Mother   . Hypertension Father    History  Substance Use Topics  . Smoking status: Never Smoker   . Smokeless tobacco: Not on file  . Alcohol Use: Yes     Comment: socially   OB History   Grav Para Term Preterm Abortions TAB SAB Ect Mult Living   4 2 2  2 1 1   2      Review of Systems  Constitutional: Negative for fever and fatigue.  HENT: Positive for congestion.   Eyes: Negative for redness.  Respiratory: Positive for chest tightness and shortness of breath.   Cardiovascular: Positive for chest pain.  Gastrointestinal: Negative for vomiting and abdominal pain.  Genitourinary: Negative for dysuria.  Musculoskeletal: Negative for back pain and neck pain.  Skin: Negative for rash.  Neurological: Negative for seizures, syncope and headaches.  Hematological: Does  not bruise/bleed easily.  Psychiatric/Behavioral: Negative for confusion. The patient is nervous/anxious.     Allergies  Review of patient's allergies indicates no known allergies.  Home Medications   Current Outpatient Rx  Name  Route  Sig  Dispense  Refill  . Iron TABS   Oral   Take 1 tablet by mouth daily.          BP 99/52  Pulse 57  Temp(Src) 98.3 F (36.8 C) (Oral)  Resp 17  Ht 5\' 7"  (1.702 m)  Wt 184 lb (83.462 kg)  BMI 28.81 kg/m2  SpO2 100%  LMP 09/14/2013 Physical Exam  Nursing note and vitals reviewed. Constitutional: She is oriented to person, place, and time. She appears well-developed and well-nourished. No distress.  HENT:  Head: Normocephalic and atraumatic.  Mouth/Throat: Oropharynx is clear and moist.  Eyes: Conjunctivae and EOM are normal. Pupils are equal, round, and reactive to light.  Neck: Normal range of motion. Neck supple.  Cardiovascular: Normal rate, regular rhythm and normal heart sounds.   No murmur heard. Pulmonary/Chest: Effort normal and breath sounds normal. No respiratory distress. She has no wheezes. She has no rales.  Abdominal: Soft. Bowel sounds are normal. There is no tenderness.  Musculoskeletal: Normal range of motion. She exhibits no edema.  Neurological: She is alert and oriented to person, place, and time. No cranial nerve deficit. She exhibits normal muscle tone. Coordination normal.  Skin: Skin is warm. No rash  noted.    ED Course  Procedures (including critical care time) Labs Review Labs Reviewed  CBC WITH DIFFERENTIAL - Abnormal; Notable for the following:    Hemoglobin 11.8 (*)    HCT 35.9 (*)    All other components within normal limits  TROPONIN I  D-DIMER, QUANTITATIVE  BASIC METABOLIC PANEL   Results for orders placed during the hospital encounter of 09/21/13  TROPONIN I      Result Value Range   Troponin I <0.30  <0.30 ng/mL  D-DIMER, QUANTITATIVE      Result Value Range   D-Dimer, Quant 0.40  0.00  - 0.48 ug/mL-FEU  CBC WITH DIFFERENTIAL      Result Value Range   WBC 5.7  4.0 - 10.5 K/uL   RBC 3.94  3.87 - 5.11 MIL/uL   Hemoglobin 11.8 (*) 12.0 - 15.0 g/dL   HCT 16.1 (*) 09.6 - 04.5 %   MCV 91.1  78.0 - 100.0 fL   MCH 29.9  26.0 - 34.0 pg   MCHC 32.9  30.0 - 36.0 g/dL   RDW 40.9  81.1 - 91.4 %   Platelets 311  150 - 400 K/uL   Neutrophils Relative % 51  43 - 77 %   Neutro Abs 2.9  1.7 - 7.7 K/uL   Lymphocytes Relative 41  12 - 46 %   Lymphs Abs 2.3  0.7 - 4.0 K/uL   Monocytes Relative 7  3 - 12 %   Monocytes Absolute 0.4  0.1 - 1.0 K/uL   Eosinophils Relative 1  0 - 5 %   Eosinophils Absolute 0.0  0.0 - 0.7 K/uL   Basophils Relative 0  0 - 1 %   Basophils Absolute 0.0  0.0 - 0.1 K/uL  BASIC METABOLIC PANEL      Result Value Range   Sodium 137  135 - 145 mEq/L   Potassium 4.1  3.5 - 5.1 mEq/L   Chloride 100  96 - 112 mEq/L   CO2 29  19 - 32 mEq/L   Glucose, Bld 90  70 - 99 mg/dL   BUN 10  6 - 23 mg/dL   Creatinine, Ser 7.82  0.50 - 1.10 mg/dL   Calcium 9.3  8.4 - 95.6 mg/dL   GFR calc non Af Amer >90  >90 mL/min   GFR calc Af Amer >90  >90 mL/min    Imaging Review Dg Chest 2 View  09/21/2013   CLINICAL DATA:  Shortness of breath, anxiety attack  EXAM: CHEST  2 VIEW  COMPARISON:  None.  FINDINGS: Lungs are clear.  No pleural effusion or pneumothorax.  Cardiomediastinal silhouette is within normal limits.  Visualized osseous structures are within normal limits.  IMPRESSION: Normal chest radiographs.   Electronically Signed   By: Charline Bills M.D.   On: 09/21/2013 20:12    EKG Interpretation   None      Date: 09/21/2013  Rate: 61  Rhythm: normal sinus rhythm  QRS Axis: normal  Intervals: normal  ST/T Wave abnormalities: normal  Conduction Disutrbances:none  Narrative Interpretation:   Old EKG Reviewed: none available    MDM   1. Chest pain   2. Anxiety    The patient with acute onset of chest pain probably a panic attack following the marijuana  use last evening these occurred last evening. 911 was called was evaluated by EMS and was checked out okay to stay. Patient still feeling strange not feeling panicky any longer but  has sort of anxious feeling no further chest pain. Still has some mild shortness of breath. Workup negative no wheezing on exam EKG without any acute changes troponin was negative d-dimer negative not likely related to a pulmonary embolus. Basic labs were normal. No sniffing and leukocytosis or anemia. Picture lites were normal including renal function and troponin also was negative this does not appear to be an acute cardiac event no evidence of pneumonia or pneumothorax no evidence of pulmonary embolism. Suspect symptoms are related to a side effect of the THC it may have been laced with something unusual. Patient is clinically safe for going home.    Shelda Jakes, MD 09/21/13 2101

## 2013-09-21 NOTE — Discharge Instructions (Signed)
Return for any new or worse symptoms. Today's workup was negative. Would avoid exposure to marijuana for a period of time. Resource guide provided below to help you find a primary care Dr.   Emergency Department Resource Guide 1) Find a Doctor and Pay Out of Pocket Although you won't have to find out who is covered by your insurance plan, it is a good idea to ask around and get recommendations. You will then need to call the office and see if the doctor you have chosen will accept you as a new patient and what types of options they offer for patients who are self-pay. Some doctors offer discounts or will set up payment plans for their patients who do not have insurance, but you will need to ask so you aren't surprised when you get to your appointment.  2) Contact Your Local Health Department Not all health departments have doctors that can see patients for sick visits, but many do, so it is worth a call to see if yours does. If you don't know where your local health department is, you can check in your phone book. The CDC also has a tool to help you locate your state's health department, and many state websites also have listings of all of their local health departments.  3) Find a Walk-in Clinic If your illness is not likely to be very severe or complicated, you may want to try a walk in clinic. These are popping up all over the country in pharmacies, drugstores, and shopping centers. They're usually staffed by nurse practitioners or physician assistants that have been trained to treat common illnesses and complaints. They're usually fairly quick and inexpensive. However, if you have serious medical issues or chronic medical problems, these are probably not your best option.  No Primary Care Doctor: - Call Health Connect at  [phone] - they can help you locate a primary care doctor that  accepts your insurance, provides certain services, etc. - Physician Referral Service- [phone]  Chronic  Pain Problems: Organization         Address  Phone   Notes  [facility]  [phone] Patients need to be referred by their primary care doctor.   Medication Assistance: Organization         Address  Phone   Notes  [facility] Medication [facility] [address] [place] [place], [place] [address] [phone] --Must be a resident of [facility] -- Must have NO insurance coverage whatsoever (no Medicaid/ Medicare, etc.) -- The pt. MUST have a primary care doctor that directs their care regularly and follows them in the community   [facility]  [phone]   [employer]  [phone]    Agencies that provide inexpensive medical care: Organization         Address  Phone   Notes  [facility]  [phone]   [facility]    [phone]   [facility] [address] [place], [place] [address] [phone]   [facility] [address] [place], [place] [phone]   Planned Parenthood    [phone]   [facility]    [phone]   [facility]  [address], [place] Phone:  [phone], Fax:  [phone] Hours of Operation:  9 am - 6 pm, M-F.  Also accepts Medicaid/Medicare and self-pay.  [facility]  [address], [place] Phone: [phone], Fax: [phone]. Hours of Operation:  8:30 am - 5:30 pm, M-F.  Also accepts Medicaid and self-pay.  °[facility] [place] [address], [place] Phone: [phone]   °[facility] [address], [place], [place] [phone], Ext. 123 Mondays & Thursdays: 7-9 AM.  First 15 patients are seen on a first come, first serve basis. °  ° °Medicaid-accepting [place] Providers: ° °Organization         Address  Phone   Notes  °[facility] [address], [place] [phone] Also  accepts self-pay patients.  °[facility] [address], [place] ° [phone]   °[facility] [address] [place] [address], [place] [phone]   °[facility] [address] [place] [address], [place] [phone]   °Veita Bland [address], [place]  ° [phone] Only accepts [place] Access Medicaid patients after they have their name applied to their card.  ° °Self-Pay (no insurance) in [place]: ° °Organization         Address  Phone   Notes  °Sickle Cell Patients, [facility] [address], [place] [phone]   °[facility] [address], [place] [phone]   °[facility] [place] ° [address], [place] [phone]) [phone]   °[facility]/Dr. Osei-Bonsu ° [address] [place] [address], [place] or [address], [place] [phone] Phone number for both [place] and [place] locations is the same.  °[facility] [address], [place] [phone]   °[facility] [address] [place] [address], [place] or [address] [phone]) [phone] °([phone]) [phone]   °[facility] [address], [place] [phone]) [phone], phone; [phone], fax Sees patients 1st and 3rd Saturday of every month.  Must not qualify for public or private insurance (i.e. Medicaid, Medicare, [place] Health Choice, Veterans' Benefits) • Household income should be no more than 200% of the poverty level •The clinic cannot treat you if you are pregnant or think you are pregnant • Sexually transmitted diseases are not treated at the clinic.  ° ° °Dental Care: °Organization         Address  Phone  Notes  °[place] [facility] [address], [place] [phone]) [phone] Accepts children up to age 21 who are enrolled in Medicaid or [place] Health Choice; pregnant  women with a Medicaid card; and children who have applied for Medicaid or [place] Health Choice, but were declined, whose parents can pay a reduced fee at time of service.  °[place] [facility] [place]  [address], [place] [phone]) [phone] Accepts children up to age 21 who are enrolled in Medicaid or [place] Health Choice; pregnant women with a Medicaid card; and children who have applied for Medicaid or  Health Choice, but were declined, whose parents can pay a reduced fee at time of service.  °[facility] ° [address],  [phone] Patients are seen by appointment only. Walk-ins are not accepted. [facility] will see patients 18 years of age and older. °Monday - Tuesday (8am-5pm) °Most Wednesdays (8:30-5pm) °$30 per visit, cash only  °[facility]  [address], [facility] [phone] Patients are seen by appointment only. Walk-ins are not accepted. [place] will see patients 60 years of age and older. One Wednesday Evening (Monthly: Volunteer Based).  $30 per visit, cash only  [place]  [phone] for adults; Children under age 4, call Graduate Pediatric Dentistry at [phone]. Children aged 49-14, please call [phone] to request a pediatric application.  Dental services are provided in all areas of dental care including fillings, crowns and bridges, complete and partial dentures, implants, gum treatment, root canals, and extractions. Preventive care is also provided. Treatment is provided to both adults and children. Patients are selected via a lottery and there is often a waiting list.   [facility] [address] [place]  [phone] [email or web address]   [facility] [address] [place], [place] [phone], Ext. [phone] Second and Fourth Thursday of each month, opens at 6:30 AM; Clinic ends at 9 AM.  Patients are  seen on a first-come first-served basis, and a limited number are seen during each clinic.   [facility]  [address] Hillard Danker [place], [place] [phone]   Eligibility Requirements You must have lived in [place], [place], or [place] counties for at least the last three months.   You cannot be eligible for state or federal sponsored [employer], including [employer], [place], or [employer].   You generally cannot be eligible for healthcare insurance through your employer.    How to apply: Eligibility screenings are held every Tuesday and Wednesday afternoon from 1:00 pm until 4:00 pm. You do not need an appointment for the interview!  [facility] [address] [place], [place]   [place] Department  [place]  [phone]    Behavioral Health Resources in the Community: Intensive Outpatient Programs Organization         Address  Phone  Notes  [facility] [place]. [address], [place], [place] [phone]   [facility] Outpatient [address], [place], [place]   [facility]: [facility] [address] [place] [address], [place], [place]   [place] [address],  [place], [place] or [phone]   Substance Abuse Resources Organization         Address  Phone  Notes  [facility]  [phone]   [place]  [phone]   [facility] [place]   Chinita Pester  [phone]   Residential & Outpatient Substance Abuse Program  [phone]   Psychological Services Organization         Address  Phone  Notes  [facility] [place]  [place]  [phone]   [place] [address] [place] [phone] or [phone]    Mobile Crisis  Teams Organization         Address  Phone  Notes  [facility], Mobile Crisis Care Unit  [phone]   [facility]  [address] [place], [place]   [facility] [address] [place] [phone]    Self-Help/Support Groups Organization         Address  Phone             Notes  [facility] [place] - variety of support groups  [place] Call for more information  Narcotics Anonymous (NA), [facility] [address], [employer] [place]  2 meetings at this location   Special educational needs teacher         Address  Phone  Notes  [facility] [place],    [place]  [phone]   [facility]  [address], [place] [address], [place], [place]   [place], [place] [phone] Admissions: 8am-3pm M-F  Incentives Substance [place] [address],    [place], [place] [phone]   [facility] [address] [place], [place], [place]   [facility] [address]  [place], [place]   [facility] - Intensive Outpatient [address], [place], [place]   [facility] [place]  [place], [place] [phone] or [phone]   [facility] ([facility]) [address] [place], [place] Accepts Medicaid  [facility] [place] [address],  [place] [phone] Substance Abuse/Addiction Treatment   [facility] Organization         Address  Phone  Notes  [facility]  [phone]   Domenic Schwab, PhD [address] Arlis Porta [place], [place]   [phone] or [phone]   [place], [place] [phone]   [facility] [address], [place], [place] [phone]  Insurance/Medicaid/sponsorship through [facility] Families [address] [place] [address]                                    [place], [place] [phone] [place] [address], [place] [phone]    Dr. Adele Schilder  [phone]   [facility] Dept. 1) [address] [place] 2) [place] 3)  [address] [address], [place] [phone]  [phone]   [place] [phone] or [phone] (After Hours)

## 2013-09-21 NOTE — ED Notes (Signed)
Patient transported to X-ray 

## 2014-04-15 ENCOUNTER — Encounter (HOSPITAL_COMMUNITY): Payer: Self-pay | Admitting: Emergency Medicine

## 2014-04-15 ENCOUNTER — Emergency Department (HOSPITAL_COMMUNITY)
Admission: EM | Admit: 2014-04-15 | Discharge: 2014-04-15 | Disposition: A | Discharge status: Home / Self Care | Payer: Self-pay | Attending: Emergency Medicine | Admitting: Emergency Medicine

## 2014-04-15 DIAGNOSIS — J302 Other seasonal allergic rhinitis: Secondary | ICD-10-CM

## 2014-04-15 DIAGNOSIS — J309 Allergic rhinitis, unspecified: Secondary | ICD-10-CM | POA: Insufficient documentation

## 2014-04-15 DIAGNOSIS — J029 Acute pharyngitis, unspecified: Secondary | ICD-10-CM | POA: Insufficient documentation

## 2014-04-15 DIAGNOSIS — R0982 Postnasal drip: Secondary | ICD-10-CM | POA: Insufficient documentation

## 2014-04-15 LAB — RAPID STREP SCREEN (MED CTR MEBANE ONLY): Streptococcus, Group A Screen (Direct): NEGATIVE

## 2014-04-15 MED ORDER — FLUTICASONE PROPIONATE 50 MCG/ACT NA SUSP: 2.0000 | Freq: Every day | NASAL | Status: DC

## 2014-04-15 NOTE — ED Provider Notes (Signed)
CSN: [id number]     Arrival date & time 04/15/14  2309 History   None    This chart was scribed for non-physician practitioner, Zacarias Pontes PA-C working with Julianne Rice, MD by Forrestine Him, ED Scribe. This patient was seen in room TR08C/TR08C and the patient's care was started at 11:30 PM.   Chief Complaint  Patient presents with  . Sore Throat   Patient is a 33 y.o. female presenting with pharyngitis. The history is provided by the patient. No language interpreter was used.  Sore Throat This is a new problem. The current episode started yesterday. The problem occurs constantly. The problem has not changed since onset.Pertinent negatives include no chest pain, no abdominal pain, no headaches and no shortness of breath. Nothing aggravates the symptoms. Nothing relieves the symptoms.  Sore Throat This is a new problem. The current episode started yesterday. The problem occurs constantly. The problem has not changed since onset.Associated symptoms include coughing and a sore throat. Pertinent negatives include no abdominal pain, arthralgias, chest pain, chills, congestion, fever, headaches, myalgias, nausea, neck pain, numbness, rash, swollen glands, vomiting or weakness. Nothing aggravates the symptoms.    HPI Comments: Kathleen Levy is a 33 y.o. female with a PMHx of seasonal allergies who presents to the Emergency Department complaining of constant, moderate, non-radiating sore throat x 3 days that is unchanged. States she feels as though "coal is stuck in my throat". She also reports an associated dry cough onset today. She has tried mouthwash and saltwater rinses without any noticeable improvement for symptoms. Denies fatigue, trismus, rhinorrhea, throat swelling, otalgia, eye pain, eye redness, postnasal drip, HA, blurred vision, CP, SOB, abd pain, N/V/D, rash, myalgias or arthralgias. No know allergies to medications. Pt is not currently taking any daily medications. Sister was  recently diagnosed with Strep throat. She has no other pertinent past medical history. No other concerns this visit.   Past Medical History  Diagnosis Date  . No pertinent past medical history    Past Surgical History  Procedure Laterality Date  . Tubal ligation    . Dilation and curettage of uterus    . Induced abortion     Family History  Problem Relation Age of Onset  . Diabetes Mother   . Hypertension Father    History  Substance Use Topics  . Smoking status: Never Smoker   . Smokeless tobacco: Not on file  . Alcohol Use: Yes     Comment: socially   OB History   Grav Para Term Preterm Abortions TAB SAB Ect Mult Living   _0 Review of Systems  Constitutional: Negative for fever and chills.  HENT: Positive for sore throat. Negative for congestion, drooling, ear pain, postnasal drip (unsure), rhinorrhea, sinus pressure and trouble swallowing.   Eyes: Negative for pain, discharge, itching and visual disturbance.  Respiratory: Positive for cough. Negative for chest tightness, shortness of breath and wheezing.   Cardiovascular: Negative for chest pain.  Gastrointestinal: Negative for nausea, vomiting and abdominal pain.  Musculoskeletal: Negative for arthralgias, myalgias and neck pain.  Skin: Negative for rash.  Allergic/Immunologic: Positive for environmental allergies (seasonal, usually summer time).  Neurological: Negative for weakness, numbness and headaches.  Psychiatric/Behavioral: Negative for confusion.      Allergies  Review of patient's allergies indicates no known allergies.  Home Medications   Prior to Admission medications   Medication Sig Start Date End Date Taking? Authorizing  Provider  fluticasone (FLONASE) 50 MCG/ACT nasal spray Place 2 sprays into both nostrils daily. 04/15/14   Teena Mangus Strupp Camprubi-Soms, PA-C  Iron TABS Take 1 tablet by mouth daily.    Historical Provider, MD   Triage Vitals: BP 111/70  Pulse 69  Temp(Src)  97.9 F (36.6 C) (Oral)  Resp 14  SpO2 99%  LMP 04/08/2014   Physical Exam  Nursing note and vitals reviewed. Constitutional: She is oriented to person, place, and time. Vital signs are normal. She appears well-developed and well-nourished.  Non-toxic appearance. No distress.  Afebrile, non-toxic  HENT:  Head: Normocephalic and atraumatic.  Right Ear: Hearing, tympanic membrane, external ear and ear canal normal.  Left Ear: Hearing, tympanic membrane, external ear and ear canal normal.  Nose: Mucosal edema and rhinorrhea present. No sinus tenderness. Right sinus exhibits no maxillary sinus tenderness and no frontal sinus tenderness. Left sinus exhibits no maxillary sinus tenderness and no frontal sinus tenderness.  Mouth/Throat: Uvula is midline and mucous membranes are normal. No trismus in the jaw. No uvula swelling. Posterior oropharyngeal erythema present. No oropharyngeal exudate, posterior oropharyngeal edema or tonsillar abscesses.  [place]/AT, b/l ears clear of erythema or edema, good landmarks on TMs, no effusions. B/L nasal turbinate swelling and erythema, mild clear rhinorrhea noted, non-TTP with no deformity. No maxillary or frontal sinus TTP. Posterior oropharynx mildly erythematous, no edema or swelling of the tonsils, uvula midline with no deviation or tristmus, no tonsillar exudates. MMM.  Eyes: Conjunctivae, EOM and lids are normal. Pupils are equal, round, and reactive to light.  Neck: Normal range of motion and phonation normal. Neck supple.  No stridor, phonation WNL.   Cardiovascular: Normal rate, regular rhythm, normal heart sounds, intact distal pulses and normal pulses.   No murmur heard. Pulmonary/Chest: Effort normal and breath sounds normal. No stridor. No respiratory distress. She has no decreased breath sounds. She has no wheezes. She has no rhonchi. She has no rales.  CTAB, no w/r/r  Abdominal: Normal appearance. She exhibits no distension.  Musculoskeletal: Normal  range of motion.  Lymphadenopathy:       Head (right side): No submental, no submandibular, no tonsillar and no occipital adenopathy present.       Head (left side): No submental, no submandibular, no tonsillar and no occipital adenopathy present.    She has cervical adenopathy.       Right cervical: Superficial cervical adenopathy present.       Left cervical: Superficial cervical adenopathy present.  B/L anterior superficial cervical LAD, non-TTP, no tonsillar, submandibular, or occipital LAD.  Neurological: She is alert and oriented to person, place, and time.  Skin: Skin is warm, dry and intact. No rash noted.  Psychiatric: She has a normal mood and affect.    ED Course  Procedures (including critical care time)  DIAGNOSTIC STUDIES: Oxygen Saturation is 99% on RA, Normal by my interpretation.    COORDINATION OF CARE: 11:35 PM-Will give Flonase to help manage symptoms, in addition to OTC claritin which pt has. Advised pt to follow up with PCP in 1 week. Pt is to return to ED or Urgent Care if symptoms worsen. Discussed treatment plan with pt at bedside and pt agreed to plan.       Labs Review Labs Reviewed  RAPID STREP SCREEN  CULTURE, GROUP A STREP    Imaging Review No results found.   EKG Interpretation None      MDM   Final diagnoses:  Other seasonal allergic rhinitis  PND (post-nasal drip)  Sore throat    Kathleen Levy is a 33 y.o. female with a PMHx of seasonal allergies, presenting for a sore throat x3 days. Afebrile, non-toxic appearing, clear lung exam. CENTOR criteria not met, but strep test already sent which was negative. Pt with b/l nasal turbinate swelling, I believe sore throat is related to PND and seasonal allergies. Will give Flonase and claritin, f/up with PCP in 1 wk. I explained the diagnosis and have given explicit precautions to return to the ER including for any other new or worsening symptoms. The patient understands and accepts the medical  plan as it's been dictated and I have answered their questions. Discharge instructions concerning home care and prescriptions have been given. The patient is STABLE and is discharged to home in good condition.  I personally performed the services described in this documentation, which was scribed in my presence. The recorded information has been reviewed and is accurate.  BP 111/70  Pulse 69  Temp(Src) 97.9 F (36.6 C) (Oral)  Resp 14  SpO2 99%  LMP 04/08/2014   Yunus Stoklosa Strupp Camprubi-Soms, PA-C 04/16/14 0001

## 2014-04-15 NOTE — Discharge Instructions (Signed)
Continue to stay well-hydrated. Gargle warm salt water and spit it out. Continue to alternate between Tylenol and Ibuprofen for pain or fever. Use over-the-counter Claritin for additional relief. Followup with your primary care doctor in 5-7 days for recheck of ongoing symptoms or return to emergency department for emergent changing or worsening of symptoms.  Use flonase as directed for the swelling in your nose, which is leading to post-nasal drip and likely the cause of your sore throat.   Allergic Rhinitis Allergic rhinitis is when the mucous membranes in the nose respond to allergens. Allergens are particles in the air that cause your body to have an allergic reaction. This causes you to release allergic antibodies. Through a chain of events, these eventually cause you to release histamine into the blood stream. Although meant to protect the body, it is this release of histamine that causes your discomfort, such as frequent sneezing, congestion, and an itchy, runny nose.  CAUSES  Seasonal allergic rhinitis (hay fever) is caused by pollen allergens that may come from grasses, trees, and weeds. Year-round allergic rhinitis (perennial allergic rhinitis) is caused by allergens such as house dust mites, pet dander, and mold spores.  SYMPTOMS   Nasal stuffiness (congestion).  Itchy, runny nose with sneezing and tearing of the eyes. DIAGNOSIS  Your health care provider can help you determine the allergen or allergens that trigger your symptoms. If you and your health care provider are unable to determine the allergen, skin or blood testing may be used. TREATMENT  Allergic rhinitis does not have a cure, but it can be controlled by:  Medicines and allergy shots (immunotherapy).  Avoiding the allergen. Hay fever may often be treated with antihistamines in pill or nasal spray forms. Antihistamines block the effects of histamine. There are over-the-counter medicines that may help with nasal congestion  and swelling around the eyes. Check with your health care provider before taking or giving this medicine.  If avoiding the allergen or the medicine prescribed do not work, there are many new medicines your health care provider can prescribe. Stronger medicine may be used if initial measures are ineffective. Desensitizing injections can be used if medicine and avoidance does not work. Desensitization is when a patient is given ongoing shots until the body becomes less sensitive to the allergen. Make sure you follow up with your health care provider if problems continue. HOME CARE INSTRUCTIONS It is not possible to completely avoid allergens, but you can reduce your symptoms by taking steps to limit your exposure to them. It helps to know exactly what you are allergic to so that you can avoid your specific triggers. SEEK MEDICAL CARE IF:   You have a fever.  You develop a cough that does not stop easily (persistent).  You have shortness of breath.  You start wheezing.  Symptoms interfere with normal daily activities. Document Released: 06/14/2001 Document Revised: 09/24/2013 Document Reviewed: 05/27/2013 [facility] Patient Information 2015 [place], [place]. This information is not intended to replace advice given to you by your health care provider. Make sure you discuss any questions you have with your health care provider.  Sore Throat A sore throat is a painful, burning, sore, or scratchy feeling of the throat. There may be pain or tenderness when swallowing or talking. You may have other symptoms with a sore throat. These include coughing, sneezing, fever, or a swollen neck. A sore throat is often the first sign of another sickness. These sicknesses may include a cold, flu, strep throat, or an  infection called mono. Most sore throats go away without medical treatment.  HOME CARE   Only take medicine as told by your doctor.  Drink enough fluids to keep your pee (urine) clear or pale  yellow.  Rest as needed.  Try using throat sprays, lozenges, or suck on hard candy (if older than 4 years or as told).  Sip warm liquids, such as broth, herbal tea, or warm water with honey. Try sucking on frozen ice pops or drinking cold liquids.  Rinse the mouth (gargle) with salt water. Mix 1 teaspoon salt with 8 ounces of water.  Do not smoke. Avoid being around others when they are smoking.  Put a humidifier in your bedroom at night to moisten the air. You can also turn on a hot shower and sit in the bathroom for 5-10 minutes. Be sure the bathroom door is closed. GET HELP RIGHT AWAY IF:   You have trouble breathing.  You cannot swallow fluids, soft foods, or your spit (saliva).  You have more puffiness (swelling) in the throat.  Your sore throat does not get better in 7 days.  You feel sick to your stomach (nauseous) and throw up (vomit).  You have a fever or lasting symptoms for more than 2-3 days.  You have a fever and your symptoms suddenly get worse. MAKE SURE YOU:   Understand these instructions.  Will watch your condition.  Will get help right away if you are not doing well or get worse. Document Released: 06/28/2008 Document Revised: 06/13/2012 Document Reviewed: 05/27/2012 [facility] Patient Information 2015 [place], [place]. This information is not intended to replace advice given to you by your health care provider. Make sure you discuss any questions you have with your health care provider.  Salt Water Gargle This solution will help make your mouth and throat feel better. HOME CARE INSTRUCTIONS   Mix 1 teaspoon of salt in 8 ounces of warm water.  Gargle with this solution as much or often as you need or as directed. Swish and gargle gently if you have any sores or wounds in your mouth.  Do not swallow this mixture. Document Released: 06/23/2004 Document Revised: 12/12/2011 Document Reviewed: 11/14/2008 [facility] Patient Information 2015 [place], [place].  This information is not intended to replace advice given to you by your health care provider. Make sure you discuss any questions you have with your health care provider.

## 2014-04-15 NOTE — ED Notes (Signed)
Pt unsure when sore throat began.  States her sister was dx with strep throat.  Her son has a sore throat and was seen, but does not have strep throat.  Pt wants to r/o strep throat for herself.

## 2014-04-16 NOTE — ED Provider Notes (Signed)
Medical screening examination/treatment/procedure(s) were performed by non-physician practitioner and as supervising physician I was immediately available for consultation/collaboration.   EKG Interpretation None        Doree Kuehne, MD 04/16/14 0601 

## 2014-04-18 LAB — CULTURE, GROUP A STREP

## 2014-08-04 ENCOUNTER — Encounter (HOSPITAL_COMMUNITY): Payer: Self-pay | Admitting: Emergency Medicine

## 2015-07-12 ENCOUNTER — Encounter (HOSPITAL_COMMUNITY): Payer: Self-pay | Admitting: Emergency Medicine

## 2015-07-12 ENCOUNTER — Emergency Department (HOSPITAL_COMMUNITY): Payer: Self-pay

## 2015-07-12 ENCOUNTER — Emergency Department (HOSPITAL_COMMUNITY)
Admission: EM | Admit: 2015-07-12 | Discharge: 2015-07-12 | Disposition: A | Discharge status: Home / Self Care | Payer: Self-pay | Attending: Emergency Medicine | Admitting: Emergency Medicine

## 2015-07-12 DIAGNOSIS — S93401A Sprain of unspecified ligament of right ankle, initial encounter: Secondary | ICD-10-CM | POA: Insufficient documentation

## 2015-07-12 DIAGNOSIS — Y9389 Activity, other specified: Secondary | ICD-10-CM | POA: Insufficient documentation

## 2015-07-12 DIAGNOSIS — X58XXXA Exposure to other specified factors, initial encounter: Secondary | ICD-10-CM | POA: Insufficient documentation

## 2015-07-12 DIAGNOSIS — Y998 Other external cause status: Secondary | ICD-10-CM | POA: Insufficient documentation

## 2015-07-12 DIAGNOSIS — Y9289 Other specified places as the place of occurrence of the external cause: Secondary | ICD-10-CM | POA: Insufficient documentation

## 2015-07-12 MED ORDER — IBUPROFEN 600 MG PO TABS: 600.0000 mg | ORAL_TABLET | Freq: Four times a day (QID) | ORAL | Status: DC | PRN

## 2015-07-12 MED ORDER — IBUPROFEN 400 MG PO TABS
600.0000 mg | ORAL_TABLET | Freq: Once | ORAL | Status: AC
  Administered 2015-07-12: 600 mg via ORAL
  Filled 2015-07-12: qty 2

## 2015-07-12 NOTE — ED Notes (Signed)
Patient transported to X-ray 

## 2015-07-12 NOTE — Discharge Instructions (Signed)
Ibuprofen for pan. Ice. Elevate. ASO brace when walking around. Crutches for 1-2 days as needed. Follow up with primary care doctor.    Ankle Sprain An ankle sprain is an injury to the strong, fibrous tissues (ligaments) that hold the bones of your ankle joint together.  CAUSES An ankle sprain is usually caused by a fall or by twisting your ankle. Ankle sprains most commonly occur when you step on the outer edge of your foot, and your ankle turns inward. People who participate in sports are more prone to these types of injuries.  SYMPTOMS   Pain in your ankle. The pain may be present at rest or only when you are trying to stand or walk.  Swelling.  Bruising. Bruising may develop immediately or within 1 to 2 days after your injury.  Difficulty standing or walking, particularly when turning corners or changing directions. DIAGNOSIS  Your caregiver will ask you details about your injury and perform a physical exam of your ankle to determine if you have an ankle sprain. During the physical exam, your caregiver will press on and apply pressure to specific areas of your foot and ankle. Your caregiver will try to move your ankle in certain ways. An X-ray exam may be done to be sure a bone was not broken or a ligament did not separate from one of the bones in your ankle (avulsion fracture).  TREATMENT  Certain types of braces can help stabilize your ankle. Your caregiver can make a recommendation for this. Your caregiver may recommend the use of medicine for pain. If your sprain is severe, your caregiver may refer you to a surgeon who helps to restore function to parts of your skeletal system (orthopedist) or a physical therapist. HOME CARE INSTRUCTIONS   Apply ice to your injury for 1-2 days or as directed by your caregiver. Applying ice helps to reduce inflammation and pain.  Put ice in a plastic bag.  Place a towel between your skin and the bag.  Leave the ice on for 15-20 minutes at a time,  every 2 hours while you are awake.  Only take over-the-counter or prescription medicines for pain, discomfort, or fever as directed by your caregiver.  Elevate your injured ankle above the level of your heart as much as possible for 2-3 days.  If your caregiver recommends crutches, use them as instructed. Gradually put weight on the affected ankle. Continue to use crutches or a cane until you can walk without feeling pain in your ankle.  If you have a plaster splint, wear the splint as directed by your caregiver. Do not rest it on anything harder than a pillow for the first 24 hours. Do not put weight on it. Do not get it wet. You may take it off to take a shower or bath.  You may have been given an elastic bandage to wear around your ankle to provide support. If the elastic bandage is too tight (you have numbness or tingling in your foot or your foot becomes cold and blue), adjust the bandage to make it comfortable.  If you have an air splint, you may blow more air into it or let air out to make it more comfortable. You may take your splint off at night and before taking a shower or bath. Wiggle your toes in the splint several times per day to decrease swelling. SEEK MEDICAL CARE IF:   You have rapidly increasing bruising or swelling.  Your toes feel extremely cold or you  lose feeling in your foot.  Your pain is not relieved with medicine. SEEK IMMEDIATE MEDICAL CARE IF:  Your toes are numb or blue.  You have severe pain that is increasing. MAKE SURE YOU:   Understand these instructions.  Will watch your condition.  Will get help right away if you are not doing well or get worse.   This information is not intended to replace advice given to you by your health care provider. Make sure you discuss any questions you have with your health care provider.   Document Released: 09/19/2005 Document Revised: 10/10/2014 Document Reviewed: 10/01/2011 Elsevier Interactive Patient Education  [employer]

## 2015-07-12 NOTE — Progress Notes (Signed)
Orthopedic Tech Progress Note Patient Details:  Kathleen Levy 1980/12/15 [id number]  Ortho Devices Type of Ortho Device: Crutches, ASO Ortho Device/Splint Interventions: Application   Saul Fordyce 07/12/2015, 10:47 AM

## 2015-07-12 NOTE — ED Provider Notes (Signed)
CSN: [id number]     Arrival date & time 07/12/15  0905 History  By signing my name below, I, Octavia Heir, attest that this documentation has been prepared under the direction and in the presence of Angellee Cohill, PA-C. Electronically Signed: Octavia Heir, ED Scribe. 07/12/2015. 9:53 AM.    Chief Complaint  Patient presents with  . Ankle Pain      The history is provided by the patient. No language interpreter was used.   HPI Comments: Kathleen Levy is a 34 y.o. female who presents to the Emergency Department complaining of constant, gradual worsening right ankle pain onset this morning. She notes she was walking down a ramp and slid down. She notes hitting her ankle and states she is unable to walk due to pain. Pt denies prior injury to her right knee and knee pain. No tx prior to coming in. No numbness or weakness  Past Medical History  Diagnosis Date  . No pertinent past medical history    Past Surgical History  Procedure Laterality Date  . Tubal ligation    . Dilation and curettage of uterus    . Induced abortion     Family History  Problem Relation Age of Onset  . Diabetes Mother   . Hypertension Father    Social History  Substance Use Topics  . Smoking status: Never Smoker   . Smokeless tobacco: Not on file  . Alcohol Use: Yes     Comment: socially   OB History    Gravida Para Term Preterm AB TAB SAB Ectopic Multiple Living   Review of Systems  Musculoskeletal: Positive for joint swelling and arthralgias.  Neurological: Negative for weakness and numbness.      Allergies  Review of patient's allergies indicates no known allergies.  Home Medications   Prior to Admission medications   Medication Sig Start Date End Date Taking? Authorizing Provider  fluticasone (FLONASE) 50 MCG/ACT nasal spray Place 2 sprays into both nostrils daily. 04/15/14   Mercedes Camprubi-Soms, PA-C  Iron TABS Take 1 tablet by mouth daily.    Historical  Provider, MD   Triage vitals: BP 97/55 mmHg  Pulse 99  Temp(Src) 98 F (36.7 C) (Oral)  Resp 17  Ht  (1.6 m)  Wt 219 lb (99.338 kg)  BMI 38.80 kg/m2  SpO2 100%  LMP 06/22/2015 Physical Exam  Constitutional: She appears well-developed and well-nourished. No distress.  HENT:  Head: Normocephalic and atraumatic.  Eyes: Right eye exhibits no discharge. Left eye exhibits no discharge.  Pulmonary/Chest: Effort normal. No respiratory distress.  Musculoskeletal:  Mild swelling noted over lateral malleolus of right ankle. ttp over lateral malleolus. No ttp over achilles or medial malleolus. Foot normal with no tenderness. Full rom of all toes. Pain with ROM of the ankle joint.  Neurological: She is alert. Coordination normal.  Skin: No rash noted. She is not diaphoretic.  Psychiatric: She has a normal mood and affect. Her behavior is normal.  Nursing note and vitals reviewed.   ED Course  Procedures  DIAGNOSTIC STUDIES: Oxygen Saturation is 100% on RA, normal by my interpretation.  COORDINATION OF CARE:  9:52 AM Discussed treatment plan which includes brace and crutches with pt at bedside and pt agreed to plan.  Labs Review Labs Reviewed - No data to display  Imaging Review Dg Ankle Complete Right  07/12/2015   CLINICAL DATA:  Fall with injury  to right ankle and pain. Initial encounter.  EXAM: RIGHT ANKLE - COMPLETE 3+ VIEW  COMPARISON:  None.  FINDINGS: No evidence of acute fracture or dislocation. The ankle mortise shows normal alignment. Soft tissues are unremarkable. No arthropathy or bony lesions identified.  IMPRESSION: Normal right ankle radiographs.   Electronically Signed   By: Irish Lack M.D.   On: 07/12/2015 09:45   I have personally reviewed and evaluated these images and lab results as part of my medical decision-making.   EKG Interpretation None      MDM   Final diagnoses:  Ankle sprain, right, initial encounter   Pt in ed after an ankle injury.  Xray negative. Neurovascularly intact. Most likely a strain. Placed in ASO. Home with crutches, ibuprofen, follow up with pcp.   Filed Vitals:   07/12/15 0912 07/12/15 0920  BP:  97/55  Pulse: 99   Temp: 98 F (36.7 C)   TempSrc: Oral   Resp: 17   Height:  (1.6 m)   Weight: 219 lb (99.338 kg)   SpO2: 100%   I personally performed the services described in this documentation, which was scribed in my presence. The recorded information has been reviewed and is accurate.   Jaynie Crumble, PA-C 07/12/15 1315  Nelva Nay, MD 07/12/15 1534

## 2015-07-12 NOTE — ED Notes (Signed)
Pt. Stated, I turned my ankle going down a ramp.  i can't hardly walk on it.

## 2015-10-14 ENCOUNTER — Emergency Department (INDEPENDENT_AMBULATORY_CARE_PROVIDER_SITE_OTHER)
Admission: EM | Admit: 2015-10-14 | Discharge: 2015-10-14 | Disposition: A | Discharge status: Home / Self Care | Payer: PRIVATE HEALTH INSURANCE | Source: Home / Self Care | Attending: Family Medicine | Admitting: Family Medicine

## 2015-10-14 ENCOUNTER — Encounter (HOSPITAL_COMMUNITY): Payer: Self-pay | Admitting: *Deleted

## 2015-10-14 DIAGNOSIS — R69 Illness, unspecified: Principal | ICD-10-CM

## 2015-10-14 DIAGNOSIS — J111 Influenza due to unidentified influenza virus with other respiratory manifestations: Secondary | ICD-10-CM

## 2015-10-14 NOTE — ED Provider Notes (Signed)
CSN: [id number]     Arrival date & time 10/14/15  1355 History   First MD Initiated Contact with Patient 10/14/15 1516     Chief Complaint  Patient presents with  . Generalized Body Aches   (Consider location/radiation/quality/duration/timing/severity/associated sxs/prior Treatment) Patient is a 35 y.o. female presenting with flu symptoms. The history is provided by the patient.  Influenza Presenting symptoms: cough, diarrhea, fatigue, fever, myalgias, nausea and rhinorrhea   Presenting symptoms: no shortness of breath, no sore throat and no vomiting   Severity:  Moderate Onset quality:  Sudden Duration:  1 day Progression:  Unchanged Chronicity:  New Relieved by:  None tried Associated symptoms: chills, decreased appetite and nasal congestion   Risk factors: sick contacts     Past Medical History  Diagnosis Date  . No pertinent past medical history    Past Surgical History  Procedure Laterality Date  . Tubal ligation    . Dilation and curettage of uterus    . Induced abortion     Family History  Problem Relation Age of Onset  . Diabetes Mother   . Hypertension Father    Social History  Substance Use Topics  . Smoking status: Never Smoker   . Smokeless tobacco: None  . Alcohol Use: Yes     Comment: socially   OB History    Gravida Para Term Preterm AB TAB SAB Ectopic Multiple Living   4 2 2  2 1 1   2      Review of Systems  Constitutional: Positive for fever, chills, appetite change, fatigue and decreased appetite.  HENT: Positive for congestion and rhinorrhea. Negative for sore throat.   Respiratory: Positive for cough. Negative for shortness of breath.   Cardiovascular: Negative.   Gastrointestinal: Positive for nausea and diarrhea. Negative for vomiting.  Genitourinary: Negative.   Musculoskeletal: Positive for myalgias.  Skin: Negative.   All other systems reviewed and are negative.   Allergies  Review of patient's allergies indicates no known  allergies.  Home Medications   Prior to Admission medications   Medication Sig Start Date End Date Taking? Authorizing Provider  fluticasone (FLONASE) 50 MCG/ACT nasal spray Place 2 sprays into both nostrils daily. 04/15/14   Mercedes Camprubi-Soms, PA-C  ibuprofen (ADVIL,MOTRIN) 600 MG tablet Take 1 tablet (600 mg total) by mouth every 6 (six) hours as needed. 07/12/15   Tatyana Kirichenko, PA-C  Iron TABS Take 1 tablet by mouth daily.    Historical Provider, MD   Meds Ordered and Administered this Visit  Medications - No data to display  BP 106/71 mmHg  Pulse 94  Temp(Src) 100.4 F (38 C) (Oral)  Resp 16  SpO2 98% No data found.   Physical Exam  Constitutional: She is oriented to person, place, and time. She appears well-developed and well-nourished. No distress.  HENT:  Head: Normocephalic.  Right Ear: External ear normal.  Left Ear: External ear normal.  Mouth/Throat: Oropharynx is clear and moist.  Neck: Normal range of motion.  Cardiovascular: Regular rhythm and normal heart sounds.   Pulmonary/Chest: Effort normal and breath sounds normal.  Abdominal: Soft. Bowel sounds are normal. There is no tenderness.  Neurological: She is alert and oriented to person, place, and time.  Skin: Skin is warm and dry. No rash noted.  Nursing note and vitals reviewed.   ED Course  Procedures (including critical care time)  Labs Review Labs Reviewed - No data to display  Imaging Review No results found.   Visual Acuity  Review  Right Eye Distance:   Left Eye Distance:   Bilateral Distance:    Right Eye Near:   Left Eye Near:    Bilateral Near:         MDM   1. Influenza-like illness    Will treat sx as needed.    Linna Hoff, MD 10/14/15 2056

## 2015-10-14 NOTE — ED Notes (Signed)
Pt  Reports  Symptoms  Of body  Aches       Nausea  And  Chills     Along   With  Being  Weak       Since  Last  Pm     She  Reports  Symptoms  Of low  Grade  Fever  As   Well     Pt  Is  Masked       At this  Time

## 2015-10-14 NOTE — Discharge Instructions (Signed)
Drink plenty of fluids as discussed,  and mucinex or delsym for cough. Return or see your doctor if further problems °

## 2015-10-16 ENCOUNTER — Encounter: Payer: Self-pay | Admitting: Obstetrics & Gynecology

## 2015-10-16 ENCOUNTER — Ambulatory Visit (INDEPENDENT_AMBULATORY_CARE_PROVIDER_SITE_OTHER): Payer: PRIVATE HEALTH INSURANCE | Admitting: Obstetrics & Gynecology

## 2015-10-16 VITALS — BP 131/75 | HR 80 | Temp 99.0°F | Resp 18 | Ht 63.0 in | Wt 214.5 lb

## 2015-10-16 DIAGNOSIS — Z1151 Encounter for screening for human papillomavirus (HPV): Secondary | ICD-10-CM | POA: Diagnosis not present

## 2015-10-16 DIAGNOSIS — Z124 Encounter for screening for malignant neoplasm of cervix: Secondary | ICD-10-CM | POA: Diagnosis not present

## 2015-10-16 DIAGNOSIS — Z113 Encounter for screening for infections with a predominantly sexual mode of transmission: Secondary | ICD-10-CM

## 2015-10-16 DIAGNOSIS — Z01419 Encounter for gynecological examination (general) (routine) without abnormal findings: Secondary | ICD-10-CM

## 2015-10-16 DIAGNOSIS — Z0000 Encounter for general adult medical examination without abnormal findings: Secondary | ICD-10-CM

## 2015-10-16 LAB — LIPID PANEL
CHOLESTEROL: 160 mg/dL (ref 125–200)
HDL: 37 mg/dL — ABNORMAL LOW (ref >=46)
LDL CALC: 105 mg/dL (ref <130)
TRIGLYCERIDES: 90 mg/dL (ref <150)
Total CHOL/HDL Ratio: 4.3 Ratio (ref <=5.0)
VLDL: 18 mg/dL (ref <30)

## 2015-10-16 LAB — TSH: TSH: 2.366 u[IU]/mL (ref 0.350–4.500)

## 2015-10-16 LAB — CBC
HCT: 33.9 % — ABNORMAL LOW (ref 36.0–46.0)
Hemoglobin: 11.2 g/dL — ABNORMAL LOW (ref 12.0–15.0)
MCH: 28.9 pg (ref 26.0–34.0)
MCHC: 33 g/dL (ref 30.0–36.0)
MCV: 87.6 fL (ref 78.0–100.0)
MPV: 9.7 fL (ref 8.6–12.4)
Platelets: 264 10*3/uL (ref 150–400)
RBC: 3.87 MIL/uL (ref 3.87–5.11)
RDW: 13.2 % (ref 11.5–15.5)
WBC: 3.4 10*3/uL — ABNORMAL LOW (ref 4.0–10.5)

## 2015-10-16 LAB — COMPREHENSIVE METABOLIC PANEL
ALBUMIN: 3.9 g/dL (ref 3.6–5.1)
ALT: 28 U/L (ref 6–29)
AST: 38 U/L — ABNORMAL HIGH (ref 10–30)
Alkaline Phosphatase: 61 U/L (ref 33–115)
BUN: 7 mg/dL (ref 7–25)
CALCIUM: 8.5 mg/dL — AB (ref 8.6–10.2)
CHLORIDE: 102 mmol/L (ref 98–110)
CO2: 25 mmol/L (ref 20–31)
CREATININE: 0.69 mg/dL (ref 0.50–1.10)
Glucose, Bld: 98 mg/dL (ref 65–99)
Potassium: 3.9 mmol/L (ref 3.5–5.3)
SODIUM: 138 mmol/L (ref 135–146)
TOTAL PROTEIN: 6.7 g/dL (ref 6.1–8.1)
Total Bilirubin: 0.2 mg/dL (ref 0.2–1.2)

## 2015-10-16 NOTE — Progress Notes (Signed)
Subjective:    Kathleen PieriniKeisha D Nieves is a 35 y.o. S AAP2 27(19 and 965 yo kids)  female who presents for an annual exam. The patient has no complaints today. The patient is not sexually active. GYN screening history: last pap: was normal. The patient wears seatbelts: yes. The patient participates in regular exercise: no. Has the patient ever been transfused or tattooed?: yes. The patient reports that there is not domestic violence in her life.   Menstrual History: OB History    Gravida Para Term Preterm AB TAB SAB Ectopic Multiple Living   4 2 2  2 1 1   2       Menarche age: 89  Patient's last menstrual period was 10/03/2015 (exact date).    The following portions of the patient's history were reviewed and updated as appropriate: allergies, current medications, past family history, past medical history, past social history, past surgical history and problem list.  Review of Systems Pertinent items are noted in HPI. abstinent for about 2 months. 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 Periods are normal. Declines flu vaccine today as she is sick with a cold.   Objective:    BP 131/75 mmHg  Pulse 80  Temp(Src) 99 F (37.2 C) (Oral)  Resp 18  Ht 5\' 3"  (1.6 m)  Wt 214 lb 8 oz (97.297 kg)  BMI 38.01 kg/m2  LMP 10/03/2015 (Exact Date)  General Appearance:    Alert, cooperative, no distress, appears stated age  Head:    Normocephalic, without obvious abnormality, atraumatic  Eyes:    PERRL, conjunctiva/corneas clear, EOM's intact, fundi    benign, both eyes  Ears:    Normal TM's and external ear canals, both ears  Nose:   Nares normal, septum midline, mucosa normal, no drainage    or sinus tenderness  Throat:   Lips, mucosa, and tongue normal; teeth and gums normal  Neck:   Supple, symmetrical, trachea midline, no adenopathy;    thyroid:  no enlargement/tenderness/nodules; no carotid   bruit or JVD  Back:     Symmetric, no curvature, ROM normal, no CVA tenderness  Lungs:     Clear to auscultation  bilaterally, respirations unlabored  Chest Wall:    No tenderness or deformity   Heart:    Regular rate and rhythm, S1 and S2 normal, no murmur, rub   or gallop  Breast Exam:    No tenderness, masses, or nipple abnormality  Abdomen:     Soft, non-tender, bowel sounds active all four quadrants,    no masses, no organomegaly  Genitalia:    Normal female without lesion, discharge or tenderness, NSSA, NT, no palpable masses     Extremities:   Extremities normal, atraumatic, no cyanosis or edema  Pulses:   2+ and symmetric all extremities  Skin:   Skin color, texture, turgor normal, no rashes or lesions  Lymph nodes:   Cervical, supraclavicular, and axillary nodes normal  Neurologic:   CNII-XII intact, normal strength, sensation and reflexes    throughout  .    Assessment:    Healthy female exam.    Plan:     Breast self exam technique reviewed and patient encouraged to perform self-exam monthly. Chlamydia specimen. GC specimen. Thin prep Pap smear. fasting labs, STI testing per her request

## 2015-10-17 LAB — HIV ANTIBODY (ROUTINE TESTING W REFLEX): HIV 1&2 Ab, 4th Generation: NONREACTIVE

## 2015-10-17 LAB — RPR

## 2015-10-17 LAB — VITAMIN D 25 HYDROXY (VIT D DEFICIENCY, FRACTURES): Vit D, 25-Hydroxy: 10 ng/mL — ABNORMAL LOW (ref 30–100)

## 2015-10-17 LAB — HEPATITIS B SURFACE ANTIGEN: Hepatitis B Surface Ag: NEGATIVE

## 2015-10-17 LAB — HEPATITIS C ANTIBODY: HCV Ab: NEGATIVE

## 2015-10-19 ENCOUNTER — Other Ambulatory Visit: Payer: Self-pay

## 2015-10-19 MED ORDER — VITAMIN D (ERGOCALCIFEROL) 1.25 MG (50000 UNIT) PO CAPS: 50000.0000 [IU] | ORAL_CAPSULE | ORAL | Status: DC

## 2015-10-19 NOTE — Telephone Encounter (Signed)
-----   Message from Allie BossierMyra C Dove, MD sent at 10/19/2015  3:54 PM EST ----- She has very low Vitamin D. Please prescribe 50,000 weekly po for 8 weeks. She will then then a re check

## 2015-10-19 NOTE — Telephone Encounter (Signed)
Medicine have been called into the pharmacy and patient notified

## 2015-10-21 LAB — CYTOLOGY - PAP

## 2015-10-28 ENCOUNTER — Telehealth: Payer: Self-pay | Admitting: General Practice

## 2015-10-28 NOTE — Telephone Encounter (Signed)
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 Called patient and informed her of results & recommendations and that someone from the front office will be contacting her to set up the appt. Patient verbalized understanding to all & had no questions

## 2015-11-25 ENCOUNTER — Encounter: Payer: PRIVATE HEALTH INSURANCE | Admitting: Obstetrics & Gynecology

## 2016-03-14 ENCOUNTER — Encounter (HOSPITAL_COMMUNITY): Payer: Self-pay | Admitting: *Deleted

## 2016-03-14 ENCOUNTER — Emergency Department (HOSPITAL_COMMUNITY)
Admission: EM | Admit: 2016-03-14 | Discharge: 2016-03-14 | Disposition: A | Discharge status: Home / Self Care | Payer: PRIVATE HEALTH INSURANCE | Attending: Emergency Medicine | Admitting: Emergency Medicine

## 2016-03-14 DIAGNOSIS — Y929 Unspecified place or not applicable: Secondary | ICD-10-CM | POA: Insufficient documentation

## 2016-03-14 DIAGNOSIS — X58XXXA Exposure to other specified factors, initial encounter: Secondary | ICD-10-CM | POA: Insufficient documentation

## 2016-03-14 DIAGNOSIS — Y999 Unspecified external cause status: Secondary | ICD-10-CM | POA: Insufficient documentation

## 2016-03-14 DIAGNOSIS — S40021A Contusion of right upper arm, initial encounter: Secondary | ICD-10-CM | POA: Insufficient documentation

## 2016-03-14 DIAGNOSIS — Y939 Activity, unspecified: Secondary | ICD-10-CM | POA: Insufficient documentation

## 2016-03-14 DIAGNOSIS — Z791 Long term (current) use of non-steroidal anti-inflammatories (NSAID): Secondary | ICD-10-CM | POA: Insufficient documentation

## 2016-03-14 DIAGNOSIS — Z79899 Other long term (current) drug therapy: Secondary | ICD-10-CM | POA: Insufficient documentation

## 2016-03-14 MED ORDER — TRAMADOL HCL 50 MG PO TABS: 50.0000 mg | ORAL_TABLET | Freq: Four times a day (QID) | ORAL | Status: DC | PRN

## 2016-03-14 MED ORDER — ENOXAPARIN SODIUM 100 MG/ML ~~LOC~~ SOLN
1.0000 mg/kg | Freq: Once | SUBCUTANEOUS | Status: AC
  Administered 2016-03-14: 90 mg via SUBCUTANEOUS
  Filled 2016-03-14: qty 1

## 2016-03-14 NOTE — ED Provider Notes (Signed)
CSN: [id number]     Arrival date & time 03/14/16  1749 History  By signing my name below, I, Kathleen Levy, attest that this documentation has been prepared under the direction and in the presence of non-physician practitioner, Arthor Captain, PA-C. Electronically Signed: Freida Levy, Scribe. 03/14/2016. 9:46 PM.  Chief Complaint  Patient presents with  . Arm Pain    The history is provided by the patient. No language interpreter was used.   HPI Comments:  Kathleen Levy is a 35 y.o. female who presents to the Emergency Department complaining of pain to the RUE x 3 days.  Pt states she was donating plasma when she moved her arm and the vein blew. She reports mild associated swelling to the site. She has been taking advil and applying ice with minimal relief. Pt has no other complaints or symptoms at this time.     Past Medical History  Diagnosis Date  . No pertinent past medical history    Past Surgical History  Procedure Laterality Date  . Tubal ligation    . Dilation and curettage of uterus    . Induced abortion     Family History  Problem Relation Age of Onset  . Diabetes Mother   . Hypertension Father    Social History  Substance Use Topics  . Smoking status: Never Smoker   . Smokeless tobacco: None  . Alcohol Use: Yes     Comment: socially   OB History    Gravida Para Term Preterm AB TAB SAB Ectopic Multiple Living   Review of Systems  Musculoskeletal: Positive for myalgias.       +RUE Swelling   Neurological: Negative for weakness.    Allergies  Review of patient's allergies indicates no known allergies.  Home Medications   Prior to Admission medications   Medication Sig Start Date End Date Taking? Authorizing Provider  fluticasone (FLONASE) 50 MCG/ACT nasal spray Place 2 sprays into both nostrils daily. Patient not taking: Reported on 10/16/2015 04/15/14   Mercedes Camprubi-Soms, PA-C  ibuprofen (ADVIL,MOTRIN) 600 MG tablet Take 1  tablet (600 mg total) by mouth every 6 (six) hours as needed. 07/12/15   Tatyana Kirichenko, PA-C  Iron TABS Take 1 tablet by mouth daily.    Historical Provider, MD  Phenyleph-Doxylamine-DM-APAP (NYQUIL SEVERE COLD/FLU) 5-6.25-10-325 MG/15ML LIQD Take by mouth.    Historical Provider, MD  Vitamin D, Ergocalciferol, (DRISDOL) 50000 units CAPS capsule Take 1 capsule (50,000 Units total) by mouth every 7 (seven) days. 10/19/15   Allie Bossier, MD   BP 121/67 mmHg  Pulse 91  Temp(Src) 98.1 F (36.7 C) (Oral)  Resp 18  SpO2 98% Physical Exam  Constitutional: She is oriented to person, place, and time. She appears well-developed and well-nourished. No distress.  HENT:  Head: Normocephalic and atraumatic.  Eyes: Conjunctivae are normal.  Cardiovascular: Normal rate.   Pulmonary/Chest: Effort normal.  Abdominal: She exhibits no distension.  Musculoskeletal:  Hematoma noted to right antecubital space; tenderness  and swell in the RUE extending in the right axilla 2+ radial pulse   Neurological: She is alert and oriented to person, place, and time.  Skin: Skin is warm and dry.  Psychiatric: She has a normal mood and affect.  Nursing note and vitals reviewed.   ED Course  Procedures  DIAGNOSTIC STUDIES:  Oxygen Saturation is 98% on RA, normal by my interpretation.    COORDINATION  OF CARE:  9:42 PM Discussed treatment plan with pt at bedside and pt agreed to plan.   MDM   Final diagnoses:  Traumatic hematoma of right upper arm, initial encounter    10:16 PM BP 121/67 mmHg  Pulse 91  Temp(Src) 98.1 F (36.7 C) (Oral)  Resp 18  Wt 91.4 kg  SpO2 98% Patient with hematoma of the R AC. With swelling and pain into the R upper extremity. Apply compression. Sub Q lovenox for DVT treatment and return for UE [place] in the am. Tramadol for pain. NVI. Appears safe for discharge. Discussed return precautions.  I personally performed the services described in this documentation, which was  scribed in my presence. The recorded information has been reviewed and is accurate.        Arthor Captainbigail Lourine Alberico, PA-C 03/14/16 2218  Tilden FossaElizabeth Rees, MD 03/15/16 [phone]

## 2016-03-14 NOTE — ED Notes (Signed)
Pt was giving plasma on Saturday, she moved her arm causing the needle to blow her vein. Pt reports right arm soreness up to her axilla. Pt presents with bruise to right AC. Pt has good pulses, full ROM

## 2016-03-14 NOTE — ED Notes (Signed)
Called x 2 no answer

## 2016-03-14 NOTE — Discharge Instructions (Signed)
Cryotherapy °Cryotherapy means treatment with cold. Ice or gel packs can be used to reduce both pain and swelling. Ice is the most helpful within the first 24 to 48 hours after an injury or flare-up from overusing a muscle or joint. Sprains, strains, spasms, burning pain, shooting pain, and aches can all be eased with ice. Ice can also be used when recovering from surgery. Ice is effective, has very few side effects, and is safe for most people to use. °PRECAUTIONS  °Ice is not a safe treatment option for people with: °· Raynaud phenomenon. This is a condition affecting small blood vessels in the extremities. Exposure to cold may cause your problems to return. °· Cold hypersensitivity. There are many forms of cold hypersensitivity, including: °¨ Cold urticaria. Red, itchy hives appear on the skin when the tissues begin to warm after being iced. °¨ Cold erythema. This is a red, itchy rash caused by exposure to cold. °¨ Cold hemoglobinuria. Red blood cells break down when the tissues begin to warm after being iced. The hemoglobin that carry oxygen are passed into the urine because they cannot combine with blood proteins fast enough. °· Numbness or altered sensitivity in the area being iced. °If you have any of the following conditions, do not use ice until you have discussed cryotherapy with your caregiver: °· Heart conditions, such as arrhythmia, angina, or chronic heart disease. °· High blood pressure. °· Healing wounds or open skin in the area being iced. °· Current infections. °· Rheumatoid arthritis. °· Poor circulation. °· Diabetes. °Ice slows the blood flow in the region it is applied. This is beneficial when trying to stop inflamed tissues from spreading irritating chemicals to surrounding tissues. However, if you expose your skin to cold temperatures for too long or without the proper protection, you can damage your skin or nerves. Watch for signs of skin damage due to cold. °HOME CARE INSTRUCTIONS °Follow  these tips to use ice and cold packs safely. °· Place a dry or damp towel between the ice and skin. A damp towel will cool the skin more quickly, so you may need to shorten the time that the ice is used. °· For a more rapid response, add gentle compression to the ice. °· Ice for no more than 10 to 20 minutes at a time. The bonier the area you are icing, the less time it will take to get the benefits of ice. °· Check your skin after 5 minutes to make sure there are no signs of a poor response to cold or skin damage. °· Rest 20 minutes or more between uses. °· Once your skin is numb, you can end your treatment. You can test numbness by very lightly touching your skin. The touch should be so light that you do not see the skin dimple from the pressure of your fingertip. When using ice, most people will feel these normal sensations in this order: cold, burning, aching, and numbness. °· Do not use ice on someone who cannot communicate their responses to pain, such as small children or people with dementia. °HOW TO MAKE AN ICE PACK °Ice packs are the most common way to use ice therapy. Other methods include ice massage, ice baths, and cryosprays. Muscle creams that cause a cold, tingly feeling do not offer the same benefits that ice offers and should not be used as a substitute unless recommended by your caregiver. °To make an ice pack, do one of the following: °· Place crushed ice or a   bag of frozen vegetables in a sealable plastic bag. Squeeze out the excess air. Place this bag inside another plastic bag. Slide the bag into a pillowcase or place a damp towel between your skin and the bag. °· Mix 3 parts water with 1 part rubbing alcohol. Freeze the mixture in a sealable plastic bag. When you remove the mixture from the freezer, it will be slushy. Squeeze out the excess air. Place this bag inside another plastic bag. Slide the bag into a pillowcase or place a damp towel between your skin and the bag. °SEEK MEDICAL CARE  IF: °· You develop white spots on your skin. This may give the skin a blotchy (mottled) appearance. °· Your skin turns blue or pale. °· Your skin becomes waxy or hard. °· Your swelling gets worse. °MAKE SURE YOU:  °· Understand these instructions. °· Will watch your condition. °· Will get help right away if you are not doing well or get worse. °  °This information is not intended to replace advice given to you by your health care provider. Make sure you discuss any questions you have with your health care provider. °  °Document Released: 05/16/2011 Document Revised: 10/10/2014 Document Reviewed: 05/16/2011 °Elsevier Interactive Patient Education ©2016 [employer] °Hematoma °A hematoma is a collection of blood under the skin, in an organ, in a body space, in a joint space, or in other tissue. The blood can clot to form a lump that you can see and feel. The lump is often firm and may sometimes become sore and tender. Most hematomas get better in a few days to weeks. However, some hematomas may be serious and require medical care. Hematomas can range in size from very small to very large. °CAUSES  °A hematoma can be caused by a blunt or penetrating injury. It can also be caused by spontaneous leakage from a blood vessel under the skin. Spontaneous leakage from a blood vessel is more likely to occur in older people, especially those taking blood thinners. Sometimes, a hematoma can develop after certain medical procedures. °SIGNS AND SYMPTOMS  °· A firm lump on the body. °· Possible pain and tenderness in the area. °· Bruising. Blue, dark blue, purple-red, or yellowish skin may appear at the site of the hematoma if the hematoma is close to the surface of the skin. °For hematomas in deeper tissues or body spaces, the signs and symptoms may be subtle. For example, an intra-abdominal hematoma may cause abdominal pain, weakness, fainting, and shortness of breath. An intracranial hematoma may cause a headache or symptoms  such as weakness, trouble speaking, or a change in consciousness. °DIAGNOSIS  °A hematoma can usually be diagnosed based on your medical history and a physical exam. Imaging tests may be needed if your health care provider suspects a hematoma in deeper tissues or body spaces, such as the abdomen, head, or chest. These tests may include ultrasonography or a CT scan.  °TREATMENT  °Hematomas usually go away on their own over time. Rarely does the blood need to be drained out of the body. Large hematomas or those that may affect vital organs will sometimes need surgical drainage or monitoring. °HOME CARE INSTRUCTIONS  °· Apply ice to the injured area:   °· Put ice in a plastic bag.   °· Place a towel between your skin and the bag.   °· Leave the ice on for 20 minutes, 2-3 times a day for the first 1 to 2 days.   °· After the first 2 days, switch   to using warm compresses on the hematoma.   °· Elevate the injured area to help decrease pain and swelling. Wrapping the area with an elastic bandage may also be helpful. Compression helps to reduce swelling and promotes shrinking of the hematoma. Make sure the bandage is not wrapped too tight.   °· If your hematoma is on a lower extremity and is painful, crutches may be helpful for a couple days.   °· Only take over-the-counter or prescription medicines as directed by your health care provider. °SEEK IMMEDIATE MEDICAL CARE IF:  °· You have increasing pain, or your pain is not controlled with medicine.   °· You have a fever.   °· You have worsening swelling or discoloration.   °· Your skin over the hematoma breaks or starts bleeding.   °· Your hematoma is in your chest or abdomen and you have weakness, shortness of breath, or a change in consciousness. °· Your hematoma is on your scalp (caused by a fall or injury) and you have a worsening headache or a change in alertness or consciousness. °MAKE SURE YOU:  °· Understand these instructions. °· Will watch your condition. °· Will  get help right away if you are not doing well or get worse. °  °This information is not intended to replace advice given to you by your health care provider. Make sure you discuss any questions you have with your health care provider. °  °Document Released: 05/03/2004 Document Revised: 05/22/2013 Document Reviewed: 02/27/2013 °Elsevier Interactive Patient Education ©2016 [employer] ° °

## 2016-03-15 ENCOUNTER — Ambulatory Visit (HOSPITAL_COMMUNITY)
Admission: RE | Admit: 2016-03-15 | Discharge: 2016-03-15 | Disposition: A | Discharge status: Home / Self Care | Payer: Self-pay | Source: Ambulatory Visit | Attending: Emergency Medicine | Admitting: Emergency Medicine

## 2016-03-15 DIAGNOSIS — M7989 Other specified soft tissue disorders: Secondary | ICD-10-CM | POA: Insufficient documentation

## 2016-03-15 DIAGNOSIS — M79609 Pain in unspecified limb: Secondary | ICD-10-CM

## 2016-03-15 DIAGNOSIS — M79601 Pain in right arm: Secondary | ICD-10-CM | POA: Insufficient documentation

## 2016-03-15 NOTE — Progress Notes (Signed)
*  Preliminary Results* Right upper extremity venous duplex completed. Right upper extremity is negative for deep and superficial vein thrombosis.  03/15/2016 9:03 AM  Gertie FeyMichelle Jonny Dearden, RVT, RDCS, RDMS

## 2017-06-10 ENCOUNTER — Emergency Department (HOSPITAL_COMMUNITY)
Admission: EM | Admit: 2017-06-10 | Discharge: 2017-06-10 | Disposition: A | Discharge status: Home / Self Care | Payer: PRIVATE HEALTH INSURANCE | Attending: Emergency Medicine | Admitting: Emergency Medicine

## 2017-06-10 ENCOUNTER — Encounter (HOSPITAL_COMMUNITY): Payer: Self-pay

## 2017-06-10 DIAGNOSIS — L02411 Cutaneous abscess of right axilla: Secondary | ICD-10-CM

## 2017-06-10 DIAGNOSIS — Z79899 Other long term (current) drug therapy: Secondary | ICD-10-CM | POA: Insufficient documentation

## 2017-06-10 MED ORDER — LIDOCAINE HCL (PF) 1 % IJ SOLN
INTRAMUSCULAR | Status: AC
Start: 2017-06-10 — End: 2017-06-10
  Administered 2017-06-10: 07:00:00
  Filled 2017-06-10: qty 30

## 2017-06-10 NOTE — ED Triage Notes (Signed)
Pt complains of a boil under her right arm for one week, the pain is getting worse

## 2017-06-10 NOTE — ED Provider Notes (Signed)
[facility]EMERGENCY DEPT Provider Note   CSN: [id number] Arrival date & time: 06/10/17  0559     History   Chief Complaint Chief Complaint  Patient presents with  . Recurrent Skin Infections    HPI Kathleen Levy is a 36 y.o. female.  HPI  36 year old female presents with worsening swelling in her right axilla. He has noticed mild swelling and pain for about 2 weeks. Over the last 24 hours it has significantly grown in worsened. She's never had an abscess before. No history of MRSA. She has not noticed any drainage or redness in the area. She does not remember an injury or break in the skin. She also complains of right ear pain during the same amount of time. She has been putting oil in it to see if it helps. It seems to wax and wane. It is worse first thing in the morning. Fevers.No history of diabetes or other significant past medical history.  Past Medical History:  Diagnosis Date  . No pertinent past medical history     There are no active problems to display for this patient.   Past Surgical History:  Procedure Laterality Date  . DILATION AND CURETTAGE OF UTERUS    . INDUCED ABORTION    . TUBAL LIGATION      OB History    Gravida Para Term Preterm AB Living   4 2 2   2 2    SAB TAB Ectopic Multiple Live Births   1 1             Home Medications    Prior to Admission medications   Medication Sig Start Date End Date Taking? Authorizing Provider  fluticasone (FLONASE) 50 MCG/ACT nasal spray Place 2 sprays into both nostrils daily. Patient not taking: Reported on 10/16/2015 04/15/14   Street, [place], PA-C  ibuprofen (ADVIL,MOTRIN) 600 MG tablet Take 1 tablet (600 mg total) by mouth every 6 (six) hours as needed. 07/12/15   Kirichenko, Lemont Fillersatyana, PA-C  Iron TABS Take 1 tablet by mouth daily.    [provider]  Phenyleph-Doxylamine-DM-APAP (NYQUIL SEVERE COLD/FLU) 5-6.25-10-325 MG/15ML LIQD Take by mouth.    [provider]  traMADol (ULTRAM) 50 MG tablet  Take 1 tablet (50 mg total) by mouth every 6 (six) hours as needed. 03/14/16   Arthor CaptainHarris, Abigail, PA-C  Vitamin D, Ergocalciferol, (DRISDOL) 50000 units CAPS capsule Take 1 capsule (50,000 Units total) by mouth every 7 (seven) days. 10/19/15   Allie Bossierove, Myra C, MD    Family History Family History  Problem Relation Age of Onset  . Diabetes Mother   . Hypertension Father     Social History Social History  Substance Use Topics  . Smoking status: Never Smoker  . Smokeless tobacco: Never Used  . Alcohol use Yes     Comment: socially     Allergies   Patient has no known allergies.   Review of Systems Review of Systems  Constitutional: Negative for fever.  HENT: Positive for ear pain.   Skin: Negative for color change and wound.  All other systems reviewed and are negative.    Physical Exam Updated Vital Signs Pulse 76   Temp 98.9 F (37.2 C) (Oral)   Resp 18   LMP 05/31/2017   SpO2 100%   Physical Exam  Constitutional: She is oriented to person, place, and time. She appears well-developed and well-nourished. No distress.  HENT:  Head: Normocephalic and atraumatic.  Right Ear: Tympanic membrane, external ear and ear canal  normal.  Left Ear: External ear normal.  Nose: Nose normal.  Eyes: Right eye exhibits no discharge. Left eye exhibits no discharge.  Pulmonary/Chest: Effort normal.  Neurological: She is alert and oriented to person, place, and time.  Skin: Skin is warm and dry. She is not diaphoretic. No erythema.  ~4 cm swelling/fluctuance just beneath skin in right axilla c/w abscess. Mildly tender. Very small amount of pus present at tip.   Nursing note and vitals reviewed.    ED Treatments / Results  Labs (all labs ordered are listed, but only abnormal results are displayed) Labs Reviewed - No data to display  EKG  EKG Interpretation None       Radiology No results found.  Procedures .Marland KitchenIncision and Drainage Date/Time: 06/10/2017 7:18 AM Performed by:  Pricilla Loveless Authorized by: Pricilla Loveless   Consent:    Consent obtained:  Verbal   Consent given by:  Patient   Risks discussed:  Bleeding, damage to other organs, infection, incomplete drainage and pain Location:    Type:  Abscess   Size:  4 cm   Location:  Upper extremity   Upper extremity location: axilla. Pre-procedure details:    Skin preparation:  Betadine Anesthesia (see MAR for exact dosages):    Anesthesia method:  Local infiltration   Local anesthetic:  Lidocaine 1% w/o epi Procedure type:    Complexity:  Simple Procedure details:    Incision types:  Single straight   Incision depth:  Dermal   Scalpel blade:  11   Wound management:  Probed and deloculated   Drainage:  Purulent   Drainage amount:  Moderate   Wound treatment:  Wound left open   Packing materials:  None Post-procedure details:    Patient tolerance of procedure:  Tolerated well, no immediate complications   (including critical care time)  Medications Ordered in ED Medications  lidocaine (PF) (XYLOCAINE) 1 % injection (  Given 06/10/17 0705)     Initial Impression / Assessment and Plan / ED Course  I have reviewed the triage vital signs and the nursing notes.  Pertinent labs & imaging results that were available during my care of the patient were reviewed by me and considered in my medical decision making (see chart for details).     No clear cause for her right ear pain. No tenderness and benign exam. She appears to have an uncomplicated abscess in her right axilla. This was drained as above. No overlying cellulitis and relatively small. I discussed possible antibiotics with patient but also discussed side effects at this time she does not want to have antibiotics. Discussed wound care and return precautions. I discussed she by needs a PCP for general maintenance as well.  Final Clinical Impressions(s) / ED Diagnoses   Final diagnoses:  Abscess of right axilla    New Prescriptions New  Prescriptions   No medications on file     Pricilla Loveless, MD 06/10/17 [phone]

## 2017-08-11 ENCOUNTER — Emergency Department (HOSPITAL_COMMUNITY)
Admission: EM | Admit: 2017-08-11 | Discharge: 2017-08-11 | Disposition: A | Discharge status: Home / Self Care | Payer: PRIVATE HEALTH INSURANCE | Attending: Emergency Medicine | Admitting: Emergency Medicine

## 2017-08-11 ENCOUNTER — Other Ambulatory Visit: Payer: Self-pay

## 2017-08-11 DIAGNOSIS — K0889 Other specified disorders of teeth and supporting structures: Secondary | ICD-10-CM | POA: Insufficient documentation

## 2017-08-11 MED ORDER — PENICILLIN V POTASSIUM 500 MG PO TABS: 500.0000 mg | ORAL_TABLET | Freq: Four times a day (QID) | ORAL | 0 refills | Status: AC

## 2017-08-11 NOTE — Discharge Instructions (Signed)
Take antibiotics as directed. Please take all of your antibiotics until finished.  You can take Tylenol or Ibuprofen as directed for pain. You can alternate Tylenol and Ibuprofen every 4 hours. If you take Tylenol at 1pm, then you can take Ibuprofen at 5pm. Then you can take Tylenol again at 9pm.   The exam and treatment you received today has been provided on an emergency basis only. This is not a substitute for complete medical or dental care. If your problem worsens or new symptoms (problems) appear, and you are unable to arrange prompt follow-up care with your dentist, call or return to this location. If you do not have a dentist, please follow-up with one on the list provided  CALL YOUR DENTIST OR RETURN IMMEDIATELY IF you develop a fever, rash, difficulty breathing or swallowing, neck or facial swelling, or other potentially serious concerns.   Please follow-up with one of the dental clinics provided to you below or in your paperwork. Call and tell them you were seen in the Emergency Dept and arrange for an appointment. You may have to call multiple places in order to find a place to be seen.  Dental Assistance If the dentist on-call cannot see you, please use the resources below:   Patients with Medicaid: [facility]  Dental [address], [phone], [phone]  If unable to pay, or uninsured, contact [facility] ([phone]) or [facility] ([phone] in [place], [phone] in [place]) to become qualified for the adult dental clinic  Other Low-Cost Community Dental Services: [facility]- [address], [place], [place], [address]    [phone], Ext. [phone]    2nd and 4th Thursday of the month at 6:30am    10 clients each day by appointment, can sometimes see walk-in     patients if someone does not show for an appointment [facility]- [address], [place], [place], [address]    [phone]  [facility]- [address], [place], , [address]    [phone]  [facility]- [phone] [facility]- [phone] [facility]- [phone]  

## 2017-08-11 NOTE — ED Provider Notes (Signed)
[facility]   CSN: [id number] Arrival date & time: 08/11/17  [id number]     History   Chief Complaint Chief Complaint  Patient presents with  . Dental Pain    HPI Kathleen Levy is a 36 y.o. female who presents with right-sided dental pain that has been ongoing patient states that she feels like may be 1 of her feelings on the right upper teeth fell out.  Patient states that this pain has been ongoing for the last 2 months but states that over the last few days, the pain is started spreading to the right side of her face.  She has not noticed any swelling, redness, warmth of the face.  Patient has not experience any fevers.  She has been able to eat and drink and swallow without any difficulty.  She reports that she has been taking ibuprofen for the pain.  Patient reports that she has not been able to see a dentist.  Patient states that she needs a dental referral so that she can see someone regarding her dental pain.  Patient denies any difficulty breathing, neck swelling, vomiting.  The history is provided by the patient.    Past Medical History:  Diagnosis Date  . No pertinent past medical history     There are no active problems to display for this patient.   Past Surgical History:  Procedure Laterality Date  . DILATION AND CURETTAGE OF UTERUS    . INDUCED ABORTION    . TUBAL LIGATION      OB History    Gravida Para Term Preterm AB Living   4 2 2   2 2    SAB TAB Ectopic Multiple Live Births   1 1             Home Medications    Prior to Admission medications   Medication Sig Start Date End Date Taking? Authorizing Provider  fluticasone (FLONASE) 50 MCG/ACT nasal spray Place 2 sprays into both nostrils daily. Patient not taking: Reported on 10/16/2015 04/15/14   Street, [place]Mercedes, PA-C  ibuprofen (ADVIL,MOTRIN) 600 MG tablet Take 1 tablet (600 mg total) by mouth every 6 (six) hours as needed. 07/12/15   Kirichenko,  Lemont Fillersatyana, PA-C  Iron TABS Take 1 tablet by mouth daily.    [provider]  penicillin v potassium (VEETID) 500 MG tablet Take 1 tablet (500 mg total) 4 (four) times daily for 7 days by mouth. 08/11/17 08/18/17  Maxwell CaulLayden, Uriyah Raska A, PA-C  Phenyleph-Doxylamine-DM-APAP (NYQUIL SEVERE COLD/FLU) 5-6.25-10-325 MG/15ML LIQD Take by mouth.    [provider]  traMADol (ULTRAM) 50 MG tablet Take 1 tablet (50 mg total) by mouth every 6 (six) hours as needed. 03/14/16   Arthor CaptainHarris, Abigail, PA-C  Vitamin D, Ergocalciferol, (DRISDOL) 50000 units CAPS capsule Take 1 capsule (50,000 Units total) by mouth every 7 (seven) days. 10/19/15   Allie Bossierove, Myra C, MD    Family History Family History  Problem Relation Age of Onset  . Diabetes Mother   . Hypertension Father     Social History Social History   Tobacco Use  . Smoking status: Never Smoker  . Smokeless tobacco: Never Used  Substance Use Topics  . Alcohol use: Yes    Comment: socially  . Drug use: No     Allergies   Patient has no known allergies.   Review of Systems Review of Systems  Constitutional: Negative for fever.  HENT: Positive for dental problem. Negative  for facial swelling and trouble swallowing.   Respiratory: Negative for shortness of breath.      Physical Exam Updated Vital Signs BP 110/69 (BP Location: Right Arm)   Pulse 89   Temp 98.4 F (36.9 C) (Oral)   Resp 16   SpO2 100%   Physical Exam  Constitutional: She appears well-developed and well-nourished.  Sitting comfortably on examination table  HENT:  Head: Normocephalic and atraumatic.  Mouth/Throat: Uvula is midline, oropharynx is clear and moist and mucous membranes are normal. No dental abscesses.  Dental caries noted to to Tooth #2. Diffuse dental caries noted throughout.  No gingival swelling or erythema noted.  No evidence of dental abscess.  No facial or neck swelling noted.  No trismus.  Uvula is midline.  No no evidence of peritonsillar  abscess.  Eyes: Conjunctivae and EOM are normal. Right eye exhibits no discharge. Left eye exhibits no discharge. No scleral icterus.  Pulmonary/Chest: Effort normal.  No evidence of respiratory distress. Able to speak in full sentences without difficulty.  Neurological: She is alert.  Skin: Skin is warm and dry.  Psychiatric: She has a normal mood and affect. Her speech is normal and behavior is normal.  Nursing note and vitals reviewed.    ED Treatments / Results  Labs (all labs ordered are listed, but only abnormal results are displayed) Labs Reviewed - No data to display  EKG  EKG Interpretation None       Radiology No results found.  Procedures Procedures (including critical care time)  Medications Ordered in ED Medications - No data to display   Initial Impression / Assessment and Plan / ED Course  I have reviewed the triage vital signs and the nursing notes.  Pertinent labs & imaging results that were available during my care of the patient were reviewed by me and considered in my medical decision making (see chart for details).     36 yo F presents with 2 months of dental pain. Now with radiation of pain to the right face. Patient is afebrile, non-toxic appearing, sitting comfortably on examination table. Vital signs reviewed and stable. No evidence of respiratory distress. Diffuse dental caries throughout. No evidence of abscess requiring immediate incision and drainage.  Symptoms likely result of dental caries noted through exam.  Exam not concerning for Ludwig's angina or pharyngeal abscess. Will treat with abx. Patient with no known drug allergies. Patient instructed to follow-up with dentist referral provided. Stable for discharge at this time. Strict return precautions discussed. Patient expresses understanding and agreement to plan.   Final Clinical Impressions(s) / ED Diagnoses   Final diagnoses:  Pain, dental    ED Discharge Orders        Ordered     penicillin v potassium (VEETID) 500 MG tablet  4 times daily     08/11/17 0941       Maxwell CaulLayden, Kallan Merrick A, PA-C 08/11/17 [id number]    Pricilla LovelessGoldston, Scott, MD 08/12/17 1955

## 2017-08-11 NOTE — ED Triage Notes (Signed)
Pt has right side dental pain for 2 months, pt thinks a filling may have came out and due to lack of insurance unable to see dentist.

## 2017-08-11 NOTE — ED Notes (Signed)
Pt with upper right dental pain better today than last night

## 2018-02-05 ENCOUNTER — Ambulatory Visit (HOSPITAL_COMMUNITY)
Admission: EM | Admit: 2018-02-05 | Discharge: 2018-02-05 | Disposition: A | Discharge status: Home / Self Care | Payer: 59 | Attending: Family Medicine | Admitting: Family Medicine

## 2018-02-05 ENCOUNTER — Encounter (HOSPITAL_COMMUNITY): Payer: Self-pay | Admitting: *Deleted

## 2018-02-05 ENCOUNTER — Ambulatory Visit (INDEPENDENT_AMBULATORY_CARE_PROVIDER_SITE_OTHER): Payer: 59

## 2018-02-05 DIAGNOSIS — S62634A Displaced fracture of distal phalanx of right ring finger, initial encounter for closed fracture: Secondary | ICD-10-CM

## 2018-02-05 DIAGNOSIS — S62662A Nondisplaced fracture of distal phalanx of right middle finger, initial encounter for closed fracture: Secondary | ICD-10-CM

## 2018-02-05 MED ORDER — MELOXICAM 7.5 MG PO TABS: 7.5000 mg | ORAL_TABLET | Freq: Every day | ORAL | 0 refills | Status: DC

## 2018-02-05 MED ORDER — HYDROCODONE-ACETAMINOPHEN 5-325 MG PO TABS: 1.0000 | ORAL_TABLET | Freq: Four times a day (QID) | ORAL | 0 refills | Status: DC | PRN

## 2018-02-05 NOTE — Discharge Instructions (Signed)
Finger splint applied.  Mobic as directed.  Norco for breakthrough pain.  Ice compress, elevation.  Follow-up with hand orthopedics for further evaluation and management needed.
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
## 2018-02-06 ENCOUNTER — Ambulatory Visit (INDEPENDENT_AMBULATORY_CARE_PROVIDER_SITE_OTHER): Payer: 59 | Admitting: Orthopaedic Surgery

## 2018-02-06 ENCOUNTER — Encounter (INDEPENDENT_AMBULATORY_CARE_PROVIDER_SITE_OTHER): Payer: Self-pay | Admitting: Orthopaedic Surgery

## 2018-02-06 VITALS — BP 95/60 | HR 71 | Ht 63.5 in | Wt 160.0 lb

## 2018-02-06 DIAGNOSIS — S62662A Nondisplaced fracture of distal phalanx of right middle finger, initial encounter for closed fracture: Secondary | ICD-10-CM | POA: Diagnosis not present

## 2018-02-06 DIAGNOSIS — M20011 Mallet finger of right finger(s): Secondary | ICD-10-CM | POA: Diagnosis not present
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
## 2018-02-20 ENCOUNTER — Encounter (INDEPENDENT_AMBULATORY_CARE_PROVIDER_SITE_OTHER): Payer: Self-pay | Admitting: Orthopaedic Surgery

## 2018-02-20 ENCOUNTER — Ambulatory Visit (INDEPENDENT_AMBULATORY_CARE_PROVIDER_SITE_OTHER): Payer: 59 | Admitting: Orthopaedic Surgery

## 2018-02-20 VITALS — BP 106/70 | HR 64 | Ht 63.5 in | Wt 160.0 lb

## 2018-02-20 DIAGNOSIS — M20011 Mallet finger of right finger(s): Secondary | ICD-10-CM

## 2018-02-20 NOTE — Progress Notes (Signed)
Office Visit Note   Patient: Kathleen Levy           Date of Birth: Jul 16, 1981           MRN: [id number] Visit Date: 02/20/2018              Requested by: No referring provider defined for this encounter. PCP: Patient, No Pcp Per   Assessment & Plan: Visit Diagnoses:  1. Mallet finger of right hand           Long and ring finger . fx's  Plan: She remains out of work for 1 more week.  Work slip given for work resumption on 02/26/2018 with no restrictions other than keeping her finger splint on.  When she returns in 6 weeks we will repeat x-rays of her right ring and small finger AP and lateral.  Follow-Up Instructions: Return in about 6 weeks (around 04/03/2018).    Orders:  No orders of the defined types were placed in this encounter.  No orders of the defined types were placed in this encounter.     Procedures: No procedures performed   Clinical Data: No additional findings.   Subjective: Chief Complaint  Patient presents with  . Right Hand - Follow-up    HPI 37 year old female returns for follow-up of distal phalanx fracture with mallet finger injury.  Nondisplaced on the long finger and comminuted on the ring finger.  She came back in today to get the stack splint change from a size 3 to a size 2 since we did not have any size to.  She is been very compliant in wearing a stack splint as we instructed.  Skin over the dorsum of the distal phalanx joint looks normal.  Sensation of the fingertip is intact.  She works for the [employer]. She was given a note for out of work for 3 weeks has more 1 more week left. Review of Systems unchanged from last office visit, updated and reviewed.   Objective: Vital Signs: BP 106/70   Pulse 64   Ht 5' 3.5" (1.613 m)   Wt 160 lb (72.6 kg)   BMI 27.90 kg/m   Physical Exam  Constitutional: She is oriented to person, place, and time. She appears well-developed.  HENT:  Head: Normocephalic.  Right Ear:  External ear normal.  Left Ear: External ear normal.  Eyes: Pupils are equal, round, and reactive to light.  Neck: No tracheal deviation present. No thyromegaly present.  Cardiovascular: Normal rate.  Pulmonary/Chest: Effort normal.  Abdominal: Soft.  Neurological: She is alert and oriented to person, place, and time.  Skin: Skin is warm and dry.  Psychiatric: She has a normal mood and affect. Her behavior is normal.    Ortho Exam stack splint is changed switch from 3 to size to for the ring finger which is a better fit.    Specialty Comments:  No specialty comments available.  Imaging: No results found.   PMFS History: There are no active problems to display for this patient.  Past Medical History:  Diagnosis Date  . No pertinent past medical history     Family History  Problem Relation Age of Onset  . Diabetes Mother   . Hypertension Father     Past Surgical History:  Procedure Laterality Date  . DILATION AND CURETTAGE OF UTERUS    . INDUCED ABORTION    . TUBAL LIGATION     Social History   Occupational History  .  Not on file  Tobacco Use  . Smoking status: Never Smoker  . Smokeless tobacco: Never Used  Substance and Sexual Activity  . Alcohol use: Yes    Comment: socially  . Drug use: No  . Sexual activity: Not Currently    Birth control/protection: Surgical

## 2018-03-20 ENCOUNTER — Other Ambulatory Visit: Payer: Self-pay

## 2018-03-20 ENCOUNTER — Encounter (HOSPITAL_COMMUNITY): Payer: Self-pay | Admitting: Emergency Medicine

## 2018-03-20 DIAGNOSIS — Y929 Unspecified place or not applicable: Secondary | ICD-10-CM | POA: Insufficient documentation

## 2018-03-20 DIAGNOSIS — S30861A Insect bite (nonvenomous) of abdominal wall, initial encounter: Secondary | ICD-10-CM | POA: Diagnosis not present

## 2018-03-20 DIAGNOSIS — Z79899 Other long term (current) drug therapy: Secondary | ICD-10-CM | POA: Insufficient documentation

## 2018-03-20 DIAGNOSIS — Y998 Other external cause status: Secondary | ICD-10-CM | POA: Insufficient documentation

## 2018-03-20 DIAGNOSIS — W57XXXA Bitten or stung by nonvenomous insect and other nonvenomous arthropods, initial encounter: Secondary | ICD-10-CM | POA: Insufficient documentation

## 2018-03-20 DIAGNOSIS — Y9389 Activity, other specified: Secondary | ICD-10-CM | POA: Insufficient documentation

## 2018-03-20 NOTE — ED Triage Notes (Signed)
Pt states that she got bit by a tick today on her rt abdomen.  Pt states it is itchy.  No other complaints.

## 2018-03-21 ENCOUNTER — Emergency Department (HOSPITAL_COMMUNITY)
Admission: EM | Admit: 2018-03-21 | Discharge: 2018-03-21 | Disposition: A | Discharge status: Home / Self Care | Payer: 59 | Attending: Emergency Medicine | Admitting: Emergency Medicine

## 2018-03-21 DIAGNOSIS — W57XXXA Bitten or stung by nonvenomous insect and other nonvenomous arthropods, initial encounter: Secondary | ICD-10-CM

## 2018-03-21 NOTE — ED Provider Notes (Signed)
[facility]-EMERGENCY DEPT Provider Note   CSN: [id number] Arrival date & time: 03/20/18  2305     History   Chief Complaint Chief Complaint  Patient presents with  . Insect Bite    HPI Kathleen Levy is a 37 y.o. female.  37 year old female presents to the emergency department for evaluation of tick bite.  She states that she was bitten by a tick earlier today to the right side of her abdomen.  She was able to remove the tick with tweezers.  She reports mild itching to the site, but denies pain.  There has been no drainage.  No associated fevers.  Patient denies flulike symptoms.  No rash since tick removed.     Past Medical History:  Diagnosis Date  . No pertinent past medical history     There are no active problems to display for this patient.   Past Surgical History:  Procedure Laterality Date  . DILATION AND CURETTAGE OF UTERUS    . INDUCED ABORTION    . TUBAL LIGATION       OB History    Gravida  4   Para  2   Term  2   Preterm      AB  2   Living  2     SAB  1   TAB  1   Ectopic      Multiple      Live Births               Home Medications    Prior to Admission medications   Medication Sig Start Date End Date Taking? Authorizing Provider  HYDROcodone-acetaminophen (NORCO/VICODIN) 5-325 MG tablet Take 1 tablet by mouth every 6 (six) hours as needed for severe pain. Patient not taking: Reported on 02/20/2018 02/05/18   Belinda Fisher, PA-C  ibuprofen (ADVIL,MOTRIN) 600 MG tablet Take 1 tablet (600 mg total) by mouth every 6 (six) hours as needed. Patient not taking: Reported on 02/06/2018 07/12/15   Jaynie Crumble, PA-C  Iron TABS Take 1 tablet by mouth daily.    [provider]  meloxicam (MOBIC) 7.5 MG tablet Take 1 tablet (7.5 mg total) by mouth daily. 02/05/18   Cathie Hoops, Amy V, PA-C  Phenyleph-Doxylamine-DM-APAP (NYQUIL SEVERE COLD/FLU) 5-6.25-10-325 MG/15ML LIQD Take by mouth.    [provider]    Vitamin D, Ergocalciferol, (DRISDOL) 50000 units CAPS capsule Take 1 capsule (50,000 Units total) by mouth every 7 (seven) days. 10/19/15   Allie Bossier, MD    Family History Family History  Problem Relation Age of Onset  . Diabetes Mother   . Hypertension Father     Social History Social History   Tobacco Use  . Smoking status: Never Smoker  . Smokeless tobacco: Never Used  Substance Use Topics  . Alcohol use: Yes    Comment: socially  . Drug use: No     Allergies   Patient has no known allergies.   Review of Systems Review of Systems Ten systems reviewed and are negative for acute change, except as noted in the HPI.    Physical Exam Updated Vital Signs BP 101/63 (BP Location: Left Arm)   Pulse 81   Temp 98.1 F (36.7 C) (Oral)   Resp 18   SpO2 100%   Physical Exam  Constitutional: She is oriented to person, place, and time. She appears well-developed and well-nourished. No distress.  Nontoxic appearing and in NAD  HENT:  Head: Normocephalic and  atraumatic.  Eyes: Conjunctivae and EOM are normal. No scleral icterus.  Neck: Normal range of motion.  Pulmonary/Chest: Effort normal. No respiratory distress.  Abdominal:    Musculoskeletal: Normal range of motion.  Neurological: She is alert and oriented to person, place, and time. She exhibits normal muscle tone. Coordination normal.  Ambulates with steady gait.  Skin: Skin is warm and dry. No rash noted. She is not diaphoretic. No erythema. No pallor.  Psychiatric: She has a normal mood and affect. Her behavior is normal.  Nursing note and vitals reviewed.    ED Treatments / Results  Labs (all labs ordered are listed, but only abnormal results are displayed) Labs Reviewed - No data to display  EKG None  Radiology No results found.  Procedures Procedures (including critical care time)  Medications Ordered in ED Medications - No data to display   Initial Impression / Assessment and Plan / ED  Course  I have reviewed the triage vital signs and the nursing notes.  Pertinent labs & imaging results that were available during my care of the patient were reviewed by me and considered in my medical decision making (see chart for details).     37 year old female presents following tick bite.  Site of tick bite without signs of secondary infection or cellulitis.  No bull's-eye rash.  Patient afebrile.  Vitals reassuring.  Counseled on watchful waiting as well as primary care follow-up.  Return precautions discussed and provided. Patient discharged in stable condition with no unaddressed concerns.   Final Clinical Impressions(s) / ED Diagnoses   Final diagnoses:  Tick bite, initial encounter    ED Discharge Orders    None       Antony MaduraHumes, Katiria Calame, PA-C 03/21/18 0258    Geoffery Lyonselo, Douglas, MD 03/21/18 [phone]

## 2018-03-21 NOTE — Discharge Instructions (Signed)
Monitor the area where you were bitten.  If you start to develop a rash resembling a bull's-eye or flu-like symptoms or fever over 100.4 F, return to the ED for repeat evaluation.

## 2018-04-02 ENCOUNTER — Telehealth (INDEPENDENT_AMBULATORY_CARE_PROVIDER_SITE_OTHER): Payer: Self-pay | Admitting: Orthopaedic Surgery

## 2018-04-02 NOTE — Telephone Encounter (Signed)
Patient called advised she has been wearing the finger splints for 8 weeks now. Patient asked should she continue wearing them or can she remove them. The number to contact patient is [phone]

## 2018-04-02 NOTE — Telephone Encounter (Signed)
Leave on until seen 7/9 when we check xrays. ucall please thanks

## 2018-04-02 NOTE — Telephone Encounter (Signed)
Please advise. Patient has follow up appointment scheduled for 04/10/18.

## 2018-04-03 NOTE — Telephone Encounter (Signed)
I called patient and advised. 

## 2018-04-10 ENCOUNTER — Ambulatory Visit (INDEPENDENT_AMBULATORY_CARE_PROVIDER_SITE_OTHER): Payer: 59 | Admitting: Orthopaedic Surgery

## 2018-04-10 ENCOUNTER — Ambulatory Visit (INDEPENDENT_AMBULATORY_CARE_PROVIDER_SITE_OTHER): Payer: Self-pay

## 2018-04-10 ENCOUNTER — Ambulatory Visit (INDEPENDENT_AMBULATORY_CARE_PROVIDER_SITE_OTHER): Payer: 59

## 2018-04-10 ENCOUNTER — Encounter (INDEPENDENT_AMBULATORY_CARE_PROVIDER_SITE_OTHER): Payer: Self-pay | Admitting: Orthopaedic Surgery

## 2018-04-10 VITALS — BP 102/64 | HR 85 | Ht 63.0 in | Wt 160.0 lb

## 2018-04-10 DIAGNOSIS — M20011 Mallet finger of right finger(s): Secondary | ICD-10-CM

## 2018-04-10 DIAGNOSIS — M25542 Pain in joints of left hand: Secondary | ICD-10-CM | POA: Diagnosis not present

## 2018-04-10 NOTE — Progress Notes (Signed)
Office Visit Note   Patient: Kathleen Levy           Date of Birth: [birth year]           MRN: [id number] Visit Date: 04/10/2018              Requested by: No referring provider defined for this encounter. PCP: Patient, No Pcp Per   Assessment & Plan: Visit Diagnoses:  1. Mallet finger of right hand     Plan: Patient can discontinue the stack splint.  Follow-Up Instructions: No follow-ups on file.   Orders:  Orders Placed This Encounter  Procedures  . XR Finger Ring Right  . XR Finger Middle Right   No orders of the defined types were placed in this encounter.     Procedures: No procedures performed   Clinical Data: No additional findings.   Subjective: Chief Complaint  Patient presents with  . Right Hand - Follow-up    HPI follow-up mallet finger injury with intra-articular fracture distal phalanx ring and long finger right hand.  She is been in a stack splint.  She is 9 weeks out.  She has been compliant wearing the stack splint.  She continues to work at the [employer].  Review of Systems 14 point review of systems updated unchanged.   Objective: Vital Signs: BP 102/64   Pulse 85   Ht 5\' 3"  (1.6 m)   Wt 160 lb (72.6 kg)   BMI 28.34 kg/m   Physical Exam  Constitutional: She is oriented to person, place, and time. She appears well-developed.  HENT:  Head: Normocephalic.  Right Ear: External ear normal.  Left Ear: External ear normal.  Eyes: Pupils are equal, round, and reactive to light.  Neck: No tracheal deviation present. No thyromegaly present.  Cardiovascular: Normal rate.  Pulmonary/Chest: Effort normal.  Abdominal: Soft.  Neurological: She is alert and oriented to person, place, and time.  Skin: Skin is warm and dry.  Psychiatric: She has a normal mood and affect. Her behavior is normal.    Ortho Exam patient has good stability at the DIP joint ring and long finger right hand.  She has 40% flexion DIP joint ring and long finger.  We  discussed exercises to work on flexion with fingertips progressing to distal palmar crease.  She will work on this 3-4 times a day I will recheck her in 3 weeks if she is not making progress she will need formal hand therapy.  Skin over the dorsum of her finger looks good out of the stack splint. Specialty Comments:  No specialty comments available.  Imaging: Xr Finger Middle Right  Result Date: 04/10/2018 2 view x-rays right long finger obtained and reviewed.  This shows intra-articular nondisplaced fracture with near complete interval healing in comparison to previous x-rays from 02/05/2018. Impression: Near complete healed right long finger distal phalanx fracture without displacement extending into the DIP joint  Xr Finger Ring Right  Result Date: 04/10/2018 AP lateral right ring finger x-rays obtained and reviewed.  This shows a interval healing fracture distal phalanx oblique with intra-articular extension with partial healing. Impression: Partial interval healing compared to previous x-rays distal phalanx with intra-articular extension.    PMFS History: There are no active problems to display for this patient.  Past Medical History:  Diagnosis Date  . No pertinent past medical history     Family History  Problem Relation Age of Onset  . Diabetes Mother   . Hypertension Father  Past Surgical History:  Procedure Laterality Date  . DILATION AND CURETTAGE OF UTERUS    . INDUCED ABORTION    . TUBAL LIGATION     Social History   Occupational History  . Not on file  Tobacco Use  . Smoking status: Never Smoker  . Smokeless tobacco: Never Used  Substance and Sexual Activity  . Alcohol use: Yes    Comment: socially  . Drug use: No  . Sexual activity: Not Currently    Birth control/protection: Surgical

## 2018-04-29 ENCOUNTER — Encounter (HOSPITAL_COMMUNITY): Payer: Self-pay | Admitting: Nurse Practitioner

## 2018-04-29 DIAGNOSIS — Z79899 Other long term (current) drug therapy: Secondary | ICD-10-CM | POA: Insufficient documentation

## 2018-04-29 DIAGNOSIS — Z87891 Personal history of nicotine dependence: Secondary | ICD-10-CM | POA: Insufficient documentation

## 2018-04-29 DIAGNOSIS — R0602 Shortness of breath: Secondary | ICD-10-CM | POA: Insufficient documentation

## 2018-04-29 LAB — BASIC METABOLIC PANEL
Anion gap: 8 (ref 5–15)
BUN: 18 mg/dL (ref 6–20)
CHLORIDE: 108 mmol/L (ref 98–111)
CO2: 27 mmol/L (ref 22–32)
Calcium: 8.9 mg/dL (ref 8.9–10.3)
Creatinine, Ser: 1.06 mg/dL — ABNORMAL HIGH (ref 0.44–1.00)
Glucose, Bld: 100 mg/dL — ABNORMAL HIGH (ref 70–99)
POTASSIUM: 4.6 mmol/L (ref 3.5–5.1)
SODIUM: 143 mmol/L (ref 135–145)

## 2018-04-29 LAB — CBC
HCT: 33.8 % — ABNORMAL LOW (ref 36.0–46.0)
Hemoglobin: 11.2 g/dL — ABNORMAL LOW (ref 12.0–15.0)
MCH: 30.8 pg (ref 26.0–34.0)
MCHC: 33.1 g/dL (ref 30.0–36.0)
MCV: 92.9 fL (ref 78.0–100.0)
Platelets: 267 10*3/uL (ref 150–400)
RBC: 3.64 MIL/uL — AB (ref 3.87–5.11)
RDW: 13.6 % (ref 11.5–15.5)
WBC: 6.6 10*3/uL (ref 4.0–10.5)

## 2018-04-29 LAB — I-STAT BETA HCG BLOOD, ED (MC, WL, AP ONLY): I-stat hCG, quantitative: <5 m[IU]/mL (ref <5)

## 2018-04-29 NOTE — ED Triage Notes (Signed)
Pt is reporting shortness of breath that has been ongoing for the last 2 weeks without anything associated symptoms. Denis any cardiopulmonary hx.

## 2018-04-30 ENCOUNTER — Emergency Department (HOSPITAL_COMMUNITY)
Admission: EM | Admit: 2018-04-30 | Discharge: 2018-04-30 | Disposition: A | Discharge status: Home / Self Care | Payer: 59 | Attending: Emergency Medicine | Admitting: Emergency Medicine

## 2018-04-30 ENCOUNTER — Emergency Department (HOSPITAL_COMMUNITY): Payer: 59

## 2018-04-30 DIAGNOSIS — R0602 Shortness of breath: Secondary | ICD-10-CM

## 2018-04-30 LAB — I-STAT TROPONIN, ED: TROPONIN I, POC: 0 ng/mL (ref 0.00–0.08)

## 2018-04-30 LAB — HEPATIC FUNCTION PANEL
ALT: 37 U/L (ref 0–44)
AST: 32 U/L (ref 15–41)
Albumin: 3.8 g/dL (ref 3.5–5.0)
Alkaline Phosphatase: 55 U/L (ref 38–126)
Total Bilirubin: 0.5 mg/dL (ref 0.3–1.2)
Total Protein: 6.9 g/dL (ref 6.5–8.1)

## 2018-04-30 LAB — D-DIMER, QUANTITATIVE (NOT AT ARMC)

## 2018-04-30 NOTE — Discharge Instructions (Signed)
Labs today overall reassuring. Recommend to establish care with local primary care doctor-- can call number on back of insurance card or the 800 number on your paperwork. Return here for any new/acute changes.

## 2018-04-30 NOTE — ED Provider Notes (Signed)
[facility]-EMERGENCY DEPT Provider Note   CSN: [id number] Arrival date & time: 04/29/18  2035     History   Chief Complaint Chief Complaint  Patient presents with  . Shortness of Breath    HPI Kathleen Levy is a 37 y.o. female.  The history is provided by the patient and medical records.  Shortness of Breath     37 year old female with no significant past medical history presenting to the ED with shortness of breath.  States she has been feeling this way for about 2 weeks.  Symptoms are intermittent but states she feels they are becoming more frequent.  States symptoms seem random in onset, not necessarily associated with exertional activities.  States when she gets short of breath she does feel like a "tightening" sensation in her throat at times, denies this currently.  She has not had any difficulty swallowing.  She denies any fever or chills.  No cough or recent upper respiratory symptoms.  No sick contacts.  No fever or chills.  She is a former smoker.  She denies any chest pain or palpitations.  She has no known cardiac history.  Does report she had a tick bite about 2 weeks ago, seen in the ED at that time.  She has not developed any new rash, fever, or joint pain.  She is not currently on any OCPs or exogenous estrogens.  No recent travel, prolonged immobilization, or trauma.  No history of DVT or PE.  Patient does report she has some issues with anxiety but denies feeling anxious or overly stressed recently.  She is not currently on any medications for anxiety.  Past Medical History:  Diagnosis Date  . No pertinent past medical history     There are no active problems to display for this patient.   Past Surgical History:  Procedure Laterality Date  . DILATION AND CURETTAGE OF UTERUS    . INDUCED ABORTION    . TUBAL LIGATION       OB History    Gravida  4   Para  2   Term  2   Preterm      AB  2   Living  2     SAB  1   TAB  1   Ectopic      Multiple      Live Births               Home Medications    Prior to Admission medications   Medication Sig Start Date End Date Taking? Authorizing Provider  ferrous sulfate (FER-IN-SOL) 75 (15 Fe) MG/ML SOLN Take 15 mg of iron by mouth daily.   Yes [provider]  Multiple Vitamin (MULTIVITAMIN WITH MINERALS) TABS tablet Take 1 tablet by mouth daily.   Yes [provider]    Family History Family History  Problem Relation Age of Onset  . Diabetes Mother   . Hypertension Father     Social History Social History   Tobacco Use  . Smoking status: Never Smoker  . Smokeless tobacco: Never Used  Substance Use Topics  . Alcohol use: Yes    Comment: socially  . Drug use: No     Allergies   Patient has no known allergies.   Review of Systems Review of Systems  Respiratory: Positive for shortness of breath.   All other systems reviewed and are negative.    Physical Exam Updated Vital Signs BP 100/63 (BP Location: Right Arm)  Pulse 65   Temp 98 F (36.7 C) (Oral)   Resp 18   SpO2 100%   Physical Exam  Constitutional: She is oriented to person, place, and time. She appears well-developed and well-nourished.  HENT:  Head: Normocephalic and atraumatic.  Mouth/Throat: Oropharynx is clear and moist.  Eyes: Pupils are equal, round, and reactive to light. Conjunctivae and EOM are normal.  Neck: Normal range of motion.  Cardiovascular: Normal rate, regular rhythm and normal heart sounds.  Pulmonary/Chest: Effort normal and breath sounds normal. She has no decreased breath sounds. She has no wheezes. She has no rales.  Lungs overall clear, no distress, speaking in full sentences without difficulty  Abdominal: Soft. Bowel sounds are normal.  Musculoskeletal: Normal range of motion.  Neurological: She is alert and oriented to person, place, and time.  Skin: Skin is warm and dry.  Psychiatric: She has a normal mood and affect.    Nursing note and vitals reviewed.    ED Treatments / Results  Labs (all labs ordered are listed, but only abnormal results are displayed) Labs Reviewed  BASIC METABOLIC PANEL - Abnormal; Notable for the following components:      Result Value   Glucose, Bld 100 (*)    Creatinine, Ser 1.06 (*)    All other components within normal limits  CBC - Abnormal; Notable for the following components:   RBC 3.64 (*)    Hemoglobin 11.2 (*)    HCT 33.8 (*)    All other components within normal limits  HEPATIC FUNCTION PANEL  D-DIMER, QUANTITATIVE (NOT AT [facility])  I-STAT BETA HCG BLOOD, ED ([facility], [facility], [facility] ONLY)  I-STAT TROPONIN, ED    EKG EKG Interpretation  Date/Time:  Sunday April 29 2018 22:23:25 EDT Ventricular Rate:  67 PR Interval:    QRS Duration: 86 QT Interval:  374 QTC Calculation: 395 R Axis:   74 Text Interpretation:  Sinus rhythm Normal ECG No previous ECGs available Confirmed by Paula LibraMolpus, John ([id number]) on 04/30/2018 5:00:27 AM   Radiology Dg Chest 2 View  Result Date: 04/30/2018 CLINICAL DATA:  Cough and shortness of breath. EXAM: CHEST - 2 VIEW COMPARISON:  09/21/2013 FINDINGS: The cardiomediastinal contours are normal. The lungs are clear. Pulmonary vasculature is normal. No consolidation, pleural effusion, or pneumothorax. No acute osseous abnormalities are seen. IMPRESSION: Negative radiographs of the chest. Electronically Signed   By: Rubye OaksMelanie  Ehinger M.D.   On: 04/30/2018 02:32    Procedures Procedures (including critical care time)  Medications Ordered in ED Medications - No data to display   Initial Impression / Assessment and Plan / ED Course  I have reviewed the triage vital signs and the nursing notes.  Pertinent labs & imaging results that were available during my care of the patient were reviewed by me and considered in my medical decision making (see chart for details).  37 year old female here with shortness of breath.  Intermittent over the past 2 weeks  but reports episodes are becoming more frequent.  She has not had any chest pain or upper respiratory symptoms.  She is afebrile and nontoxic.  Lungs are clear without any wheezes or rhonchi on exam, respirations are unlabored, talking in full sentences without difficulty.  Vital signs are stable.  EKG is nonischemic.  Labs are overall reassuring.  Chest x-ray is clear.  Patient does report tick bite 2 weeks ago but has not had any fever, joint pain, new rash.  LFTs and platelets are normal so I have lower  suspicion for tickborne illness at this time.  D-dimer was also sent and is negative.  She has no significant risk factors for PE and has no tachycardia or hypoxia.  Doubt ACS, PE, dissection, acute cardiac event.  Patient does report history of anxiety, however does not appear overly anxious here and no new stressors at work or home.  Uncertain etiology of her SOB, however appears stable so appropriate for discharge with OP follow-up.  Patient is insured but does not currently have PCP, she was given information on how to establish care with local clinic.  She will return here for any new/acute changes.    Final Clinical Impressions(s) / ED Diagnoses   Final diagnoses:  Shortness of breath    ED Discharge Orders    None       Garlon Hatchet, PA-C 04/30/18 0522    Molpus, Jonny Ruiz, MD 04/30/18 [phone]

## 2018-04-30 NOTE — ED Notes (Signed)
Patient transported to X-ray 

## 2018-05-01 ENCOUNTER — Ambulatory Visit (INDEPENDENT_AMBULATORY_CARE_PROVIDER_SITE_OTHER): Payer: 59 | Admitting: Orthopaedic Surgery

## 2018-05-31 ENCOUNTER — Ambulatory Visit (INDEPENDENT_AMBULATORY_CARE_PROVIDER_SITE_OTHER): Payer: Self-pay | Admitting: Clinical

## 2018-05-31 ENCOUNTER — Encounter: Payer: Self-pay | Admitting: Obstetrics & Gynecology

## 2018-05-31 ENCOUNTER — Ambulatory Visit (INDEPENDENT_AMBULATORY_CARE_PROVIDER_SITE_OTHER): Payer: 59 | Admitting: Obstetrics & Gynecology

## 2018-05-31 VITALS — BP 115/63 | HR 75 | Wt 181.3 lb

## 2018-05-31 DIAGNOSIS — Z124 Encounter for screening for malignant neoplasm of cervix: Secondary | ICD-10-CM

## 2018-05-31 DIAGNOSIS — F439 Reaction to severe stress, unspecified: Secondary | ICD-10-CM

## 2018-05-31 DIAGNOSIS — Z01419 Encounter for gynecological examination (general) (routine) without abnormal findings: Secondary | ICD-10-CM

## 2018-05-31 DIAGNOSIS — F4322 Adjustment disorder with anxiety: Secondary | ICD-10-CM

## 2018-05-31 DIAGNOSIS — Z113 Encounter for screening for infections with a predominantly sexual mode of transmission: Secondary | ICD-10-CM

## 2018-05-31 DIAGNOSIS — Z1151 Encounter for screening for human papillomavirus (HPV): Secondary | ICD-10-CM

## 2018-05-31 NOTE — BH Specialist Note (Addendum)
Integrated Behavioral Health Initial Visit  MRN: [id number] Name: Kathleen Levy  Number of Integrated Behavioral Health Clinician visits:: 1/6 Session Start time: 9:35  Session End time: 10:10 Total time: 30 minutes  Type of Service: Integrated Behavioral Health- Individual/Family Interpretor:No. Interpretor Name and Language: n/a   Warm Hand Off Completed.       SUBJECTIVE: Kathleen Levy is a 37 y.o. female accompanied by n/a Patient was referred by Nicholaus BloomMyra Dove, MD for stress. Patient reports the following symptoms/concerns: Pt states her primary concern today is stress with anxiety with panic attacks; pt very open to learning self-coping strategies.  Duration of problem: Increase in past 6 months; Severity of problem: moderate  OBJECTIVE: Mood: Anxious and Affect: Appropriate Risk of harm to self or others: No plan to harm self or others  LIFE CONTEXT: Family and Social: Pt lives with her 8yo son("on autism spectrum"); has 22yo daughter School/Work: Pt works full time hours, 3rd shift Self-Care: Recognizing a greater need for self-care Life Changes: Ongoing life stress with panic attack months ago, with increase in anxiety since initial panic attack  GOALS ADDRESSED: Patient will: 1. Reduce symptoms of: anxiety and stress 2. Increase knowledge and/or ability of: self-management skills and stress reduction  3. Demonstrate ability to: Increase healthy adjustment to current life circumstances  INTERVENTIONS: Interventions utilized: Mindfulness or Management consultantelaxation Training and Psychoeducation and/or Health Education  Standardized Assessments completed: GAD-7 and PHQ 9  ASSESSMENT: Patient currently experiencing Adjustment disorder with anxiety   Patient may benefit from psychoeducation and brief therapeutic interventions regarding coping with symptoms of anxiety with panic attacks .  PLAN: 1. Follow up with behavioral health clinician on : As needed 2. Behavioral  recommendations:  -CALM relaxation breathing first hour upon waking; as needed throughout the day -Consider meditation and sleep apps as needed for additional self-coping -Read educational materials regarding coping with symptoms of anxiety with panic attacks 3. Referral(s): [facility] 4. "From scale of 1-10, how likely are you to follow plan?": 10  Rae LipsJamie C Brandy Kabat, LCSW  Dep[facility] screen [facility] 05/31/2018  Decreased Interest 1  Down, Depressed, Hopeless 1  PHQ - 2 Score 2  Altered sleeping 1  Tired, decreased energy 1  Change in appetite 1  Feeling bad or failure about yourself  1  Trouble concentrating 1  Moving slowly or fidgety/restless 0  Suicidal thoughts 0  PHQ-9 Score 7   GAD 7 : Generalized Anxiety Score 05/31/2018  Nervous, Anxious, on Edge 3  Control/stop worrying 2  Worry too much - different things 2  Trouble relaxing 2  Restless 0  Easily annoyed or irritable 3  Afraid - awful might happen 1  Total GAD 7 Score 13

## 2018-05-31 NOTE — Progress Notes (Signed)
Subjective:    Kathleen PieriniKeisha D Cadman is a 37 y.o. single P2 (598 yo son and 39 yo daughter) female who presents for an annual exam. The patient has no complaints today, would like STI testing. She is having a lot of stress. The patient is not currently sexually active. GYN screening history: last pap: was normal. The patient wears seatbelts: yes. The patient participates in regular exercise: no. Has the patient ever been transfused or tattooed?: yes. The patient reports that there is not domestic violence in her life.   Menstrual History: OB History    Gravida  4   Para  2   Term  2   Preterm      AB  2   Living  2     SAB  1   TAB  1   Ectopic      Multiple      Live Births              Menarche age: 89 Patient's last menstrual period was 05/15/2018.    The following portions of the patient's history were reviewed and updated as appropriate: allergies, current medications, past family history, past medical history, past social history, past surgical history and problem list.  Review of Systems Pertinent items are noted in HPI.   Periods are normal and last 4 days, monthly "like clockwork" FH- no breast/gyn cancer, + colon cancer in paternal GF Works at the [employer] [place] She had a BTL after the delivery of her second child.    Objective:    BP 115/63   Pulse 75   Wt 181 lb 4.8 oz (82.2 kg)   LMP 05/15/2018   BMI 32.12 kg/m   General Appearance:    Alert, cooperative, no distress, appears stated age  Head:    Normocephalic, without obvious abnormality, atraumatic  Eyes:    PERRL, conjunctiva/corneas clear, EOM's intact, fundi    benign, both eyes  Ears:    Normal TM's and external ear canals, both ears  Nose:   Nares normal, septum midline, mucosa normal, no drainage    or sinus tenderness  Throat:   Lips, mucosa, and tongue normal; teeth and gums normal  Neck:   Supple, symmetrical, trachea midline, no adenopathy;    thyroid:  no  enlargement/tenderness/nodules; no carotid   bruit or JVD  Back:     Symmetric, no curvature, ROM normal, no CVA tenderness  Lungs:     Clear to auscultation bilaterally, respirations unlabored  Chest Wall:    No tenderness or deformity   Heart:    Regular rate and rhythm, S1 and S2 normal, no murmur, rub   or gallop  Breast Exam:    No tenderness, masses, or nipple abnormality  Abdomen:     Soft, non-tender, bowel sounds active all four quadrants,    no masses, no organomegaly  Genitalia:    Normal female without lesion, discharge or tenderness, normal size and shape, anteverted, mobile, non-tender, normal adnexal exam, cervix is somewhat friable      Extremities:   Extremities normal, atraumatic, no cyanosis or edema  Pulses:   2+ and symmetric all extremities  Skin:   Skin color, texture, turgor normal, no rashes or lesions  Lymph nodes:   Cervical, supraclavicular, and axillary nodes normal  Neurologic:   CNII-XII intact, normal strength, sensation and reflexes    throughout  .    Assessment:    Healthy female exam.   Stress  Plan:     Thin prep Pap smear. with cotesting STI testing per patient request Rec appt with Dr. Sunnie Nielsen to manage her abnormal labs done at the hospital 7/19. Rec gardasil She declines flu vaccine Amb ref integrated behavior med

## 2018-06-01 LAB — RPR: RPR: NONREACTIVE

## 2018-06-01 LAB — HEPATITIS B SURFACE ANTIGEN: HEP B S AG: NEGATIVE

## 2018-06-01 LAB — HIV ANTIBODY (ROUTINE TESTING W REFLEX): HIV Screen 4th Generation wRfx: NONREACTIVE

## 2018-06-01 LAB — HEPATITIS C ANTIBODY

## 2018-06-05 LAB — CYTOLOGY - PAP
CHLAMYDIA, DNA PROBE: NEGATIVE
Diagnosis: NEGATIVE
HPV (WINDOPATH): NOT DETECTED
Neisseria Gonorrhea: NEGATIVE

## 2018-06-07 ENCOUNTER — Telehealth: Payer: Self-pay | Admitting: Clinical

## 2018-06-07 NOTE — Telephone Encounter (Signed)
Follow up call, as agreed-upon by pt; pt says she has implemented self- coping strategies from our office visit, and that the educational material provided was helpful. Pt has no further concerns at this time, and is aware she may schedule to see [facility] as needed in the future.

## 2018-06-22 ENCOUNTER — Encounter: Payer: Self-pay | Admitting: *Deleted

## 2018-09-18 ENCOUNTER — Other Ambulatory Visit: Payer: Self-pay

## 2018-09-18 ENCOUNTER — Encounter (HOSPITAL_COMMUNITY): Payer: Self-pay | Admitting: Emergency Medicine

## 2018-09-18 ENCOUNTER — Ambulatory Visit (HOSPITAL_COMMUNITY)
Admission: EM | Admit: 2018-09-18 | Discharge: 2018-09-18 | Disposition: A | Discharge status: Home / Self Care | Payer: 59 | Attending: Family Medicine | Admitting: Family Medicine

## 2018-09-18 DIAGNOSIS — K0889 Other specified disorders of teeth and supporting structures: Secondary | ICD-10-CM | POA: Diagnosis not present

## 2018-09-18 MED ORDER — HYDROCODONE-ACETAMINOPHEN 5-325 MG PO TABS: 1.0000 | ORAL_TABLET | Freq: Four times a day (QID) | ORAL | 0 refills | Status: DC | PRN

## 2018-09-18 MED ORDER — CLINDAMYCIN HCL 300 MG PO CAPS: 300.0000 mg | ORAL_CAPSULE | Freq: Three times a day (TID) | ORAL | 0 refills | Status: DC

## 2018-09-18 NOTE — ED Provider Notes (Signed)
[facility]   [id number] 09/18/18 Arrival Time: 1458  ASSESSMENT & PLAN:  1. Pain, dental    Meds ordered this encounter  Medications  . clindamycin (CLEOCIN) 300 MG capsule    Sig: Take 1 capsule (300 mg total) by mouth 3 (three) times daily.    Dispense:  30 capsule    Refill:  0  . HYDROcodone-acetaminophen (NORCO/VICODIN) 5-325 MG tablet    Sig: Take 1 tablet by mouth every 6 (six) hours as needed for moderate pain or severe pain.    Dispense:  8 tablet    Refill:  0   [place] Controlled Substances Registry consulted for this patient. I feel the risk/benefit ratio today is favorable for proceeding with this prescription for a controlled substance. Medication sedation precautions given.  Dental resource written instructions given. She will schedule dental evaluation as soon as possible.  Reviewed expectations re: course of current medical issues. Questions answered. Outlined signs and symptoms indicating need for more acute intervention. Patient verbalized understanding. After Visit Summary given.   SUBJECTIVE:  Kathleen Levy is a 37 y.o. female who reports abrupt onset of right upper dental pain described as "aching but sharp sometimes". History of similar last year; improved with antibiotics. Fever: absent. Tolerating PO intake but reports pain with chewing. Normal swallowing. She does not see a dentist regularly. Should have dental insurance starting in January. No neck swelling or pain. OTC analgesics without relief.  ROS: As per HPI.  OBJECTIVE:  Vitals:   09/18/18 1511  BP: 109/62  Pulse: 78  Resp: 18  Temp: 98.5 F (36.9 C)  TempSrc: Oral  SpO2: 100%    General appearance: alert; no distress HENT: normocephalic; atraumatic; dentition: poor with multiple caries; gingival hypertrophy over right upper gums without areas of fluctuance Neck: supple without LAD; FROM; trachea midline Lungs: normal respirations; unlabored Skin: warm and  dry Psychological: alert and cooperative; normal mood and affect  No Known Allergies  Past Medical History:  Diagnosis Date  . No pertinent past medical history    Social History   Socioeconomic History  . Marital status: Single    Spouse name: Not on file  . Number of children: Not on file  . Years of education: Not on file  . Highest education level: Not on file  Occupational History  . Not on file  Social Needs  . Financial resource strain: Not on file  . Food insecurity:    Worry: Not on file    Inability: Not on file  . Transportation needs:    Medical: Not on file    Non-medical: Not on file  Tobacco Use  . Smoking status: Never Smoker  . Smokeless tobacco: Never Used  Substance and Sexual Activity  . Alcohol use: Yes    Comment: socially  . Drug use: No  . Sexual activity: Not Currently    Birth control/protection: Surgical  Lifestyle  . Physical activity:    Days per week: Not on file    Minutes per session: Not on file  . Stress: Not on file  Relationships  . Social connections:    Talks on phone: Not on file    Gets together: Not on file    Attends religious service: Not on file    Active member of club or organization: Not on file    Attends meetings of clubs or organizations: Not on file    Relationship status: Not on file  . Intimate partner violence:  Fear of current or ex partner: Not on file    Emotionally abused: Not on file    Physically abused: Not on file    Forced sexual activity: Not on file  Other Topics Concern  . Not on file  Social History Narrative  . Not on file   Family History  Problem Relation Age of Onset  . Diabetes Mother   . Hypertension Father    Past Surgical History:  Procedure Laterality Date  . DILATION AND CURETTAGE OF UTERUS    . INDUCED ABORTION    . TUBAL LIGATION       Mardella LaymanHagler, Tedd Cottrill, MD 09/19/18 1025

## 2018-09-18 NOTE — Discharge Instructions (Addendum)
Be aware, pain medications may cause drowsiness. Please do not drive, operate heavy machinery or make important decisions while on this medication, it can cloud your judgement.  

## 2018-09-18 NOTE — ED Triage Notes (Signed)
PT reports right sided facial pain that she thinks orginates from a dental issue

## 2018-12-04 ENCOUNTER — Ambulatory Visit (INDEPENDENT_AMBULATORY_CARE_PROVIDER_SITE_OTHER): Payer: 59

## 2018-12-04 DIAGNOSIS — N760 Acute vaginitis: Secondary | ICD-10-CM | POA: Diagnosis not present

## 2018-12-04 DIAGNOSIS — B9689 Other specified bacterial agents as the cause of diseases classified elsewhere: Secondary | ICD-10-CM

## 2018-12-04 DIAGNOSIS — Z113 Encounter for screening for infections with a predominantly sexual mode of transmission: Secondary | ICD-10-CM

## 2018-12-04 DIAGNOSIS — Z202 Contact with and (suspected) exposure to infections with a predominantly sexual mode of transmission: Secondary | ICD-10-CM

## 2018-12-04 DIAGNOSIS — N898 Other specified noninflammatory disorders of vagina: Secondary | ICD-10-CM

## 2018-12-04 NOTE — Progress Notes (Signed)
Pt here today for STD testing.  Pt reports having intercourse within two weeks ago after not having intercourse for over 2 years.  Pt states that she just feels funny and wants to be tested.  I explained to the pt how to obtain self swab and that we will contact her with abnormal results. Pt stated understanding with no further questions.   Addison Naegeli, RN

## 2018-12-06 ENCOUNTER — Other Ambulatory Visit: Payer: Self-pay | Admitting: Family Medicine

## 2018-12-06 LAB — CERVICOVAGINAL ANCILLARY ONLY
BACTERIAL VAGINITIS: POSITIVE — AB
CANDIDA VAGINITIS: NEGATIVE
CHLAMYDIA, DNA PROBE: NEGATIVE
Neisseria Gonorrhea: NEGATIVE
Trichomonas: NEGATIVE

## 2018-12-06 MED ORDER — METRONIDAZOLE 500 MG PO TABS: 500.0000 mg | ORAL_TABLET | Freq: Two times a day (BID) | ORAL | 0 refills | Status: DC

## 2018-12-06 NOTE — Progress Notes (Signed)
Patient positive for bacterial vaginosis on recent wet prep. Sent flagyl to pharmacy and message to clinic pool to notify patient since I am working nights.  Gwenevere Abbot, MD 12/06/18 10:05 PM

## 2018-12-07 ENCOUNTER — Telehealth: Payer: Self-pay

## 2018-12-07 NOTE — Telephone Encounter (Signed)
Per Dr. Aneta Mins pt has BV and Flagyl tx has been sent to her pharmacy.   Notified pt results and tx.  Pt stated understanding.

## 2019-03-18 ENCOUNTER — Other Ambulatory Visit: Payer: Self-pay

## 2019-03-18 DIAGNOSIS — F410 Panic disorder [episodic paroxysmal anxiety] without agoraphobia: Secondary | ICD-10-CM | POA: Insufficient documentation

## 2019-03-18 DIAGNOSIS — Z79899 Other long term (current) drug therapy: Secondary | ICD-10-CM | POA: Insufficient documentation

## 2019-03-18 DIAGNOSIS — Z87891 Personal history of nicotine dependence: Secondary | ICD-10-CM | POA: Insufficient documentation

## 2019-03-18 DIAGNOSIS — R0602 Shortness of breath: Secondary | ICD-10-CM | POA: Diagnosis present

## 2019-03-18 NOTE — ED Triage Notes (Signed)
Patient states when she was driving to work, she states she suddenly became short of breath and jittery. She states she has a history of being anxious. She reports the jitteriness has subsided, but still experiencing shortness of breath. When entering the room, patient was using her cell phone. Respirations are even, regular, and unlabored. Appears in acute distress. Skin is warm and dry.

## 2019-03-19 ENCOUNTER — Emergency Department (HOSPITAL_COMMUNITY): Payer: 59

## 2019-03-19 ENCOUNTER — Emergency Department (HOSPITAL_COMMUNITY)
Admission: EM | Admit: 2019-03-19 | Discharge: 2019-03-19 | Disposition: A | Discharge status: Home / Self Care | Payer: 59 | Attending: Emergency Medicine | Admitting: Emergency Medicine

## 2019-03-19 ENCOUNTER — Encounter (HOSPITAL_COMMUNITY): Payer: Self-pay | Admitting: Family Medicine

## 2019-03-19 DIAGNOSIS — F419 Anxiety disorder, unspecified: Secondary | ICD-10-CM

## 2019-03-19 DIAGNOSIS — F410 Panic disorder [episodic paroxysmal anxiety] without agoraphobia: Secondary | ICD-10-CM

## 2019-03-19 NOTE — ED Provider Notes (Signed)
[facility]-EMERGENCY DEPT Provider Note  CSN: [id number] Arrival date & time: 03/18/19 2254  Chief Complaint(s) Anxiety and Shortness of Breath  HPI Kathleen Levy is a 38 y.o. female   The history is provided by the patient.  Anxiety This is a recurrent problem. The current episode started 1 to 2 hours ago. The problem has been resolved. Associated symptoms include shortness of breath. Pertinent negatives include no chest pain, no abdominal pain and no headaches. Nothing aggravates the symptoms. The symptoms are relieved by relaxation (breathing techniques). She has tried nothing for the symptoms.  Shortness of Breath Severity:  Moderate Onset quality:  Sudden Progression:  Resolved Chronicity:  Recurrent Context comment:  Anxiety Associated symptoms: no abdominal pain, no chest pain, no cough, no fever, no headaches, no sputum production and no syncope   Risk factors: no hx of PE/DVT, no oral contraceptive use and no prolonged immobilization    Similar to prior panic attacks.   Past Medical History Past Medical History:  Diagnosis Date  . No pertinent past medical history    There are no active problems to display for this patient.  Home Medication(s) Prior to Admission medications   Medication Sig Start Date End Date Taking? Authorizing Provider  clindamycin (CLEOCIN) 300 MG capsule Take 1 capsule (300 mg total) by mouth 3 (three) times daily. 09/18/18   Mardella LaymanHagler, Brian, MD  ferrous sulfate (FER-IN-SOL) 75 (15 Fe) MG/ML SOLN Take 15 mg of iron by mouth daily.    [provider]  HYDROcodone-acetaminophen (NORCO/VICODIN) 5-325 MG tablet Take 1 tablet by mouth every 6 (six) hours as needed for moderate pain or severe pain. 09/18/18   Mardella LaymanHagler, Brian, MD  metroNIDAZOLE (FLAGYL) 500 MG tablet Take 1 tablet (500 mg total) by mouth 2 (two) times daily. 12/06/18   Gwenevere AbbotPhillip, Nimeka, MD  Multiple Vitamin (MULTIVITAMIN WITH MINERALS) TABS tablet Take 1 tablet  by mouth daily.    [provider]                                                                                                                                    Past Surgical History Past Surgical History:  Procedure Laterality Date  . DILATION AND CURETTAGE OF UTERUS    . INDUCED ABORTION    . TOOTH EXTRACTION    . TUBAL LIGATION     Family History Family History  Problem Relation Age of Onset  . Diabetes Mother   . Hypertension Father     Social History Social History   Tobacco Use  . Smoking status: Former Games developermoker  . Smokeless tobacco: Never Used  Substance Use Topics  . Alcohol use: Yes    Comment: 1-2 a month   . Drug use: No   Allergies Patient has no known allergies.  Review of Systems Review of Systems  Constitutional: Negative for fever.  Respiratory: Positive for shortness of breath. Negative for  cough and sputum production.   Cardiovascular: Negative for chest pain and syncope.  Gastrointestinal: Negative for abdominal pain.  Neurological: Negative for headaches.   All other systems are reviewed and are negative for acute change except as noted in the HPI  Physical Exam Vital Signs  I have reviewed the triage vital signs BP 121/85 (BP Location: Right Arm)   Pulse 67   Temp 99.5 F (37.5 C) (Oral)   Resp 18   Ht 5\' 3"  (1.6 m)   Wt 90.7 kg   LMP 03/05/2019   SpO2 99%   BMI 35.43 kg/m   Physical Exam Vitals signs reviewed.  Constitutional:      General: She is not in acute distress.    Appearance: She is well-developed. She is not diaphoretic.  HENT:     Head: Normocephalic and atraumatic.     Nose: Nose normal.  Eyes:     General: No scleral icterus.       Right eye: No discharge.        Left eye: No discharge.     Conjunctiva/sclera: Conjunctivae normal.     Pupils: Pupils are equal, round, and reactive to light.  Neck:     Musculoskeletal: Normal range of motion and neck supple.  Cardiovascular:     Rate and Rhythm:  Normal rate and regular rhythm.     Heart sounds: No murmur. No friction rub. No gallop.   Pulmonary:     Effort: Pulmonary effort is normal. No respiratory distress.     Breath sounds: Normal breath sounds. No stridor. No rales.  Abdominal:     General: There is no distension.     Palpations: Abdomen is soft.     Tenderness: There is no abdominal tenderness.  Musculoskeletal:        General: No tenderness.  Skin:    General: Skin is warm and dry.     Findings: No erythema or rash.  Neurological:     Mental Status: She is alert and oriented to person, place, and time.     ED Results and Treatments Labs (all labs ordered are listed, but only abnormal results are displayed) Labs Reviewed - No data to display                                                                                                                       EKG  EKG Interpretation  Date/Time:  Tuesday March 19 2019 00:29:12 EDT Ventricular Rate:  61 PR Interval:    QRS Duration: 84 QT Interval:  394 QTC Calculation: 397 R Axis:   119 Text Interpretation:  Right and left arm electrode reversal, interpretation assumes no reversal Sinus rhythm Right axis deviation Abnormal T, consider ischemia, lateral leads will repeat Confirmed by Drema Pryardama, Waneta Fitting [phone]) on 03/19/2019 12:42:55 AM       EKG Interpretation  Date/Time:  Tuesday March 19 2019 00:29:12 EDT Ventricular Rate:  61 PR Interval:    QRS Duration: 84 QT Interval:  394 QTC Calculation: 397 R Axis:   119 Text Interpretation:  Right and left arm electrode reversal, interpretation assumes no reversal Sinus rhythm Right axis deviation Abnormal T, consider ischemia, lateral leads will repeat Confirmed by Addison Lank [phone]) on 03/19/2019 12:42:55 AM       Radiology Dg Chest 2 View  Result Date: 03/19/2019 CLINICAL DATA:  Shortness of breath EXAM: CHEST - 2 VIEW COMPARISON:  04/30/2018 FINDINGS: Heart and mediastinal contours are within normal limits.  No focal opacities or effusions. No acute bony abnormality. IMPRESSION: No active cardiopulmonary disease. Electronically Signed   By: Rolm Baptise M.D.   On: 03/19/2019 00:41   Pertinent labs & imaging results that were available during my care of the patient were reviewed by me and considered in my medical decision making (see chart for details).  Medications Ordered in ED Medications - No data to display                                                                                                                                  Procedures Procedures  (including critical care time)  Medical Decision Making / ED Course I have reviewed the nursing notes for this encounter and the patient's prior records (if available in EHR or on provided paperwork).    Consistent with anxiety/panic attacks. Resolved.  EKG reassuring and nonischemic. No dysrhythmia. Doubt cardiac etiology. PERC negative. Doubt PE.  Chest x-ray without evidence suggestive of pneumonia, pneumothorax, pneumomediastinum.  No abnormal contour of the mediastinum to suggest dissection.  The patient appears reasonably screened and/or stabilized for discharge and I doubt any other medical condition or other [facility] requiring further screening, evaluation, or treatment in the ED at this time prior to discharge.  The patient is safe for discharge with strict return precautions.    Final Clinical Impression(s) / ED Diagnoses Final diagnoses:  Anxiety  Panic attack   Disposition: Discharge  Condition: Good  I have discussed the results, Dx and Tx plan with the patient who expressed understanding and agree(s) with the plan. Discharge instructions discussed at great length. The patient was given strict return precautions who verbalized understanding of the instructions. No further questions at time of discharge.    ED Discharge Orders    None       Follow Up: Primary care provider  Schedule an appointment as soon as  possible for a visit        This chart was dictated using voice recognition software.  Despite best efforts to proofread,  errors can occur which can change the documentation meaning.   Fatima Blank, MD 03/19/19 [phone]

## 2019-05-07 ENCOUNTER — Ambulatory Visit (INDEPENDENT_AMBULATORY_CARE_PROVIDER_SITE_OTHER): Payer: 59

## 2019-05-07 ENCOUNTER — Encounter: Payer: Self-pay | Admitting: Emergency Medicine

## 2019-05-07 ENCOUNTER — Other Ambulatory Visit: Payer: Self-pay

## 2019-05-07 ENCOUNTER — Ambulatory Visit
Admission: EM | Admit: 2019-05-07 | Discharge: 2019-05-07 | Disposition: A | Discharge status: Home / Self Care | Payer: 59 | Attending: Physician Assistant | Admitting: Physician Assistant

## 2019-05-07 DIAGNOSIS — S99922A Unspecified injury of left foot, initial encounter: Secondary | ICD-10-CM | POA: Diagnosis not present

## 2019-05-07 MED ORDER — IBUPROFEN 800 MG PO TABS: 800.0000 mg | ORAL_TABLET | Freq: Three times a day (TID) | ORAL | 0 refills | Status: DC

## 2019-05-07 MED ORDER — DICLOFENAC SODIUM 1 % TD GEL: 2.0000 g | Freq: Four times a day (QID) | TRANSDERMAL | 0 refills | Status: DC

## 2019-05-07 NOTE — ED Provider Notes (Signed)
[facility]    CSN: [id number] Arrival date & time: 05/07/19  1027     History   Chief Complaint Chief Complaint  Patient presents with  . Toe Pain    HPI KEYARRA RENDALL is a 38 y.o. female.   38 year old female comes in for continued left fifth toe pain after injury 5 days ago.  Patient states she jammed the fifth toe on the couch.  Since then, has had painful weightbearing, throbbing of the toe.  Denies numbness, tingling.  She has been trying to wear an ankle brace so that she can walk on her heels more than her toes, however, this has caused ankle pain to develop.  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  She has not tried to take any medications for the symptoms.  States has leftover meloxicam from prior injury, but this was hard on her stomach.     Past Medical History:  Diagnosis Date  . No pertinent past medical history     There are no active problems to display for this patient.   Past Surgical History:  Procedure Laterality Date  . DILATION AND CURETTAGE OF UTERUS    . INDUCED ABORTION    . TOOTH EXTRACTION    . TUBAL LIGATION      OB History    Gravida  4   Para  2   Term  2   Preterm      AB  2   Living  2     SAB  1   TAB  1   Ectopic      Multiple      Live Births               Home Medications    Prior to Admission medications   Medication Sig Start Date End Date Taking? Authorizing Provider  diclofenac sodium (VOLTAREN) 1 % GEL Apply 2 g topically 4 (four) times daily. 05/07/19   Tasia Catchings, Lollie Gunner V, PA-C  ferrous sulfate (FER-IN-SOL) 75 (15 Fe) MG/ML SOLN Take 15 mg of iron by mouth daily.    [provider]  ibuprofen (ADVIL) 800 MG tablet Take 1 tablet (800 mg total) by mouth 3 (three) times daily. 05/07/19   Tasia Catchings, Tyrina Hines V, PA-C  Multiple Vitamin (MULTIVITAMIN WITH MINERALS) TABS tablet Take 1 tablet by mouth daily.    [provider]     Family History Family History  Problem Relation Age of Onset  . Diabetes Mother   . Hypertension Father     Social History Social History   Tobacco Use  . Smoking status: Former Research scientist (life sciences)  . Smokeless tobacco: Never Used  Substance Use Topics  . Alcohol use: Yes    Comment: 1-2 a month   . Drug use: No     Allergies   Patient has no known allergies.   Review of Systems Review of Systems  Reason unable to perform ROS: See HPI as above.     Physical Exam Triage Vital Signs ED Triage Vitals  Enc Vitals Group     BP 05/07/19 1041 130/84     Pulse Rate 05/07/19 1041 74     Resp 05/07/19 1041 18     Temp 05/07/19 1041 98.8 F (37.1 C)     Temp Source 05/07/19 1041 Oral     SpO2 05/07/19 1041 97 %  Weight --      Height --      Head Circumference --      Peak Flow --      Pain Score 05/07/19 1038 6     Pain Loc --      Pain Edu? --      Excl. in [place]? --    No data found.  Updated Vital Signs BP 130/84 (BP Location: Left Arm)   Pulse 74   Temp 98.8 F (37.1 C) (Oral)   Resp 18   SpO2 97%   Physical Exam Constitutional:      General: She is not in acute distress.    Appearance: She is well-developed. She is not diaphoretic.  HENT:     Head: Normocephalic and atraumatic.  Eyes:     Conjunctiva/sclera: Conjunctivae normal.     Pupils: Pupils are equal, round, and reactive to light.  Pulmonary:     Effort: Pulmonary effort is normal. No respiratory distress.  Musculoskeletal:     Comments: No swelling, contusion, erythema, warmth. Tenderness to palpation of distal left 5th toe. Full ROM of toe. Sensation intact. Pedal pulse 2+, cap refill <2s  Neurological:     Mental Status: She is alert and oriented to person, place, and time.     UC Treatments / Results  Labs (all labs ordered are listed, but only abnormal results are displayed) Labs Reviewed - No data to display  EKG   Radiology Dg Foot Complete Left  Result Date: 05/07/2019 CLINICAL  DATA:  Injured left fifth toe. Struck on couch. Pain and difficulty walking. EXAM: LEFT FOOT - COMPLETE 3+ VIEW COMPARISON:  None. FINDINGS: The joint spaces are maintained. No acute fracture is identified. Specifically, the fifth toe and fifth metatarsal are intact. No definite fractures. IMPRESSION: No acute fracture. Electronically Signed   By: Rudie MeyerP.  Gallerani M.D.   On: 05/07/2019 11:02    Procedures Procedures (including critical care time)  Medications Ordered in UC Medications - No data to display  Initial Impression / Assessment and Plan / UC Course  I have reviewed the triage vital signs and the nursing notes.  Pertinent labs & imaging results that were available during my care of the patient were reviewed by me and considered in my medical decision making (see chart for details).    X-ray negative for fracture or dislocation.  Discussed symptomatic treatment including NSAIDs, ice compress, elevation, rest, brace, buddy taping.  Patient would like to continue to work, and would like to try CAM Walker to help with symptoms.  Rx of ibuprofen/Voltaren gel provided, patient can use if needed.  Return precautions given.  Patient expresses understanding and agrees to plan.  Final Clinical Impressions(s) / UC Diagnoses   Final diagnoses:  Injury of toe on left foot, initial encounter   ED Prescriptions    Medication Sig Dispense Auth. Provider   diclofenac sodium (VOLTAREN) 1 % GEL Apply 2 g topically 4 (four) times daily. 150 g Stephany Poorman V, PA-C   ibuprofen (ADVIL) 800 MG tablet Take 1 tablet (800 mg total) by mouth 3 (three) times daily. 21 tablet Threasa AlphaYu, Giovana Faciane V, PA-C        Sherlin Sonier V, [place] JerseyPA-C 05/07/19 1141

## 2019-05-07 NOTE — ED Notes (Signed)
Patient able to ambulate independently  

## 2019-05-07 NOTE — ED Triage Notes (Addendum)
Pt presents to [facility] for assessment of pinky toe pain of the left foot after she slammed the pinky toe on the foot of the couch on Friday.  Patient comes in ankle brace in place from home, denies injury or pain to this area, states she was "just trying to give it some support"

## 2019-05-07 NOTE — Discharge Instructions (Signed)
Xray negative for fracture/dislocation. As discussed, you can try ibuprofen/voltaren gel for the pain/inflammation. Ice compress, elevation, rest. You can try post op shoe/boot for symptomatic relief. Follow up with PCP if symptoms not improving.

## 2019-06-16 IMAGING — DX DG CHEST 2V
2 series · 2 of 2 positions shown · non-contrast
Comparison: 09/21/2013

CLINICAL DATA: Cough and shortness of breath.

EXAM:
CHEST - 2 VIEW

[chest pa]
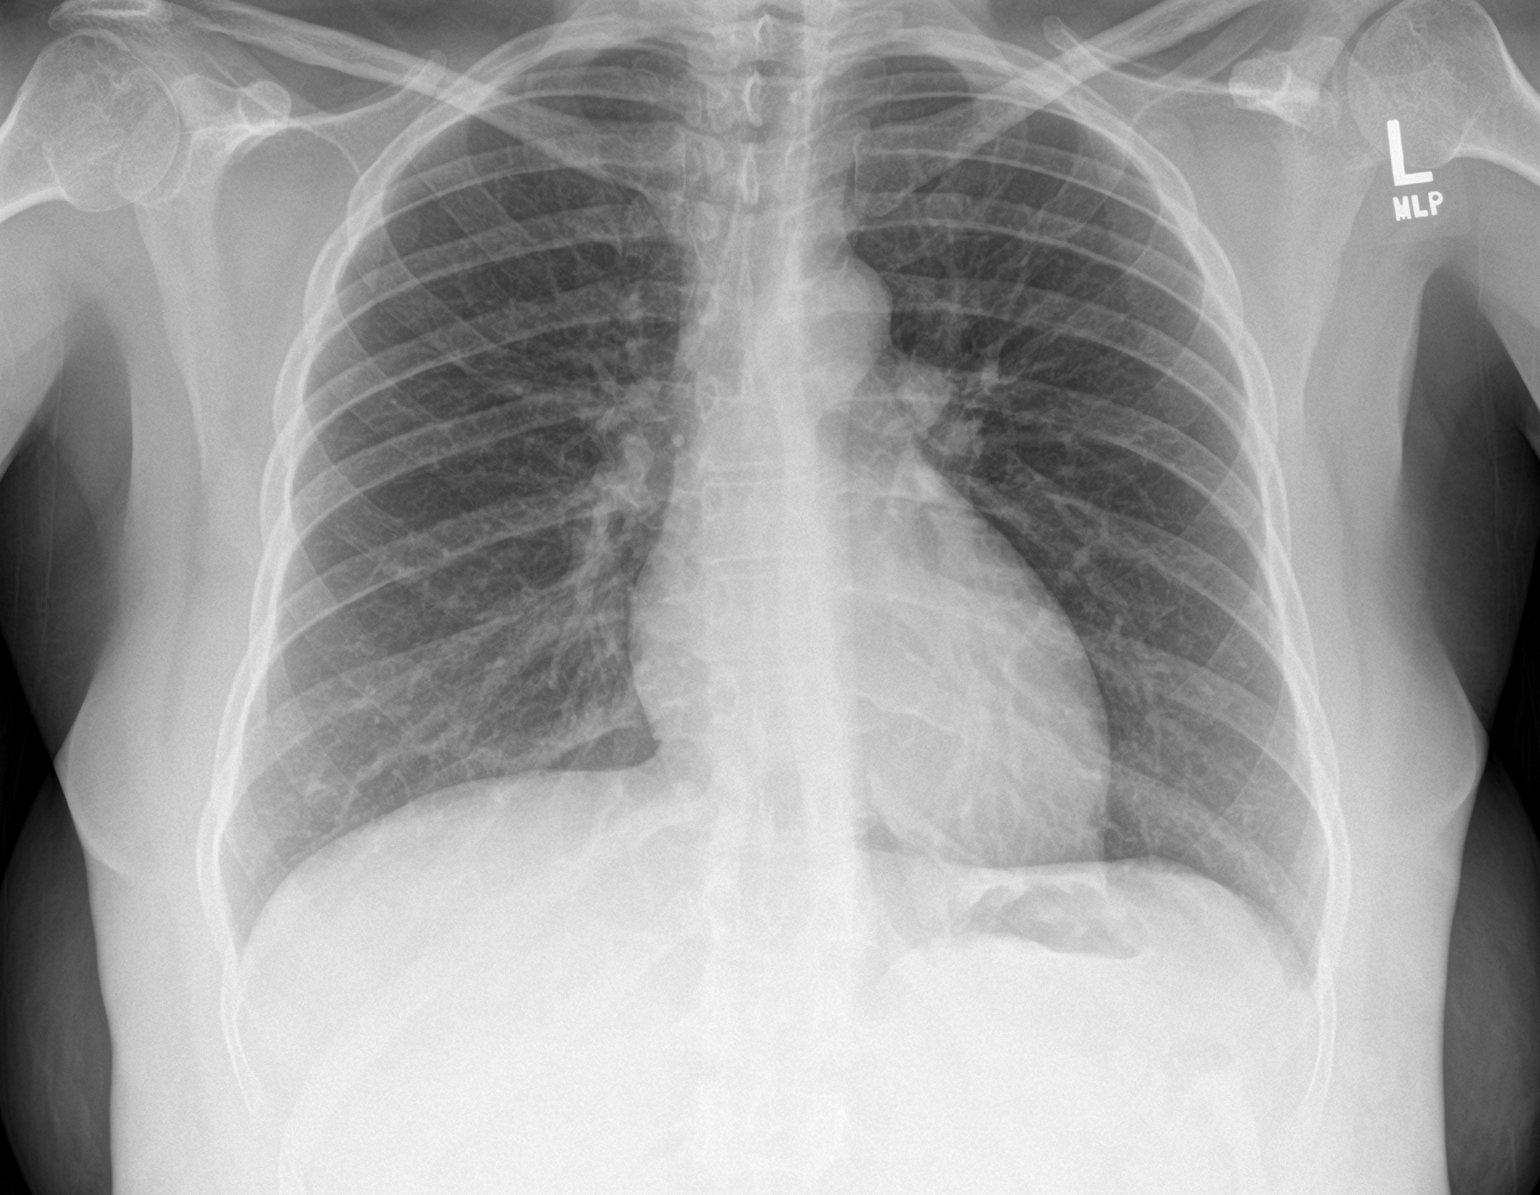

[chest lat]
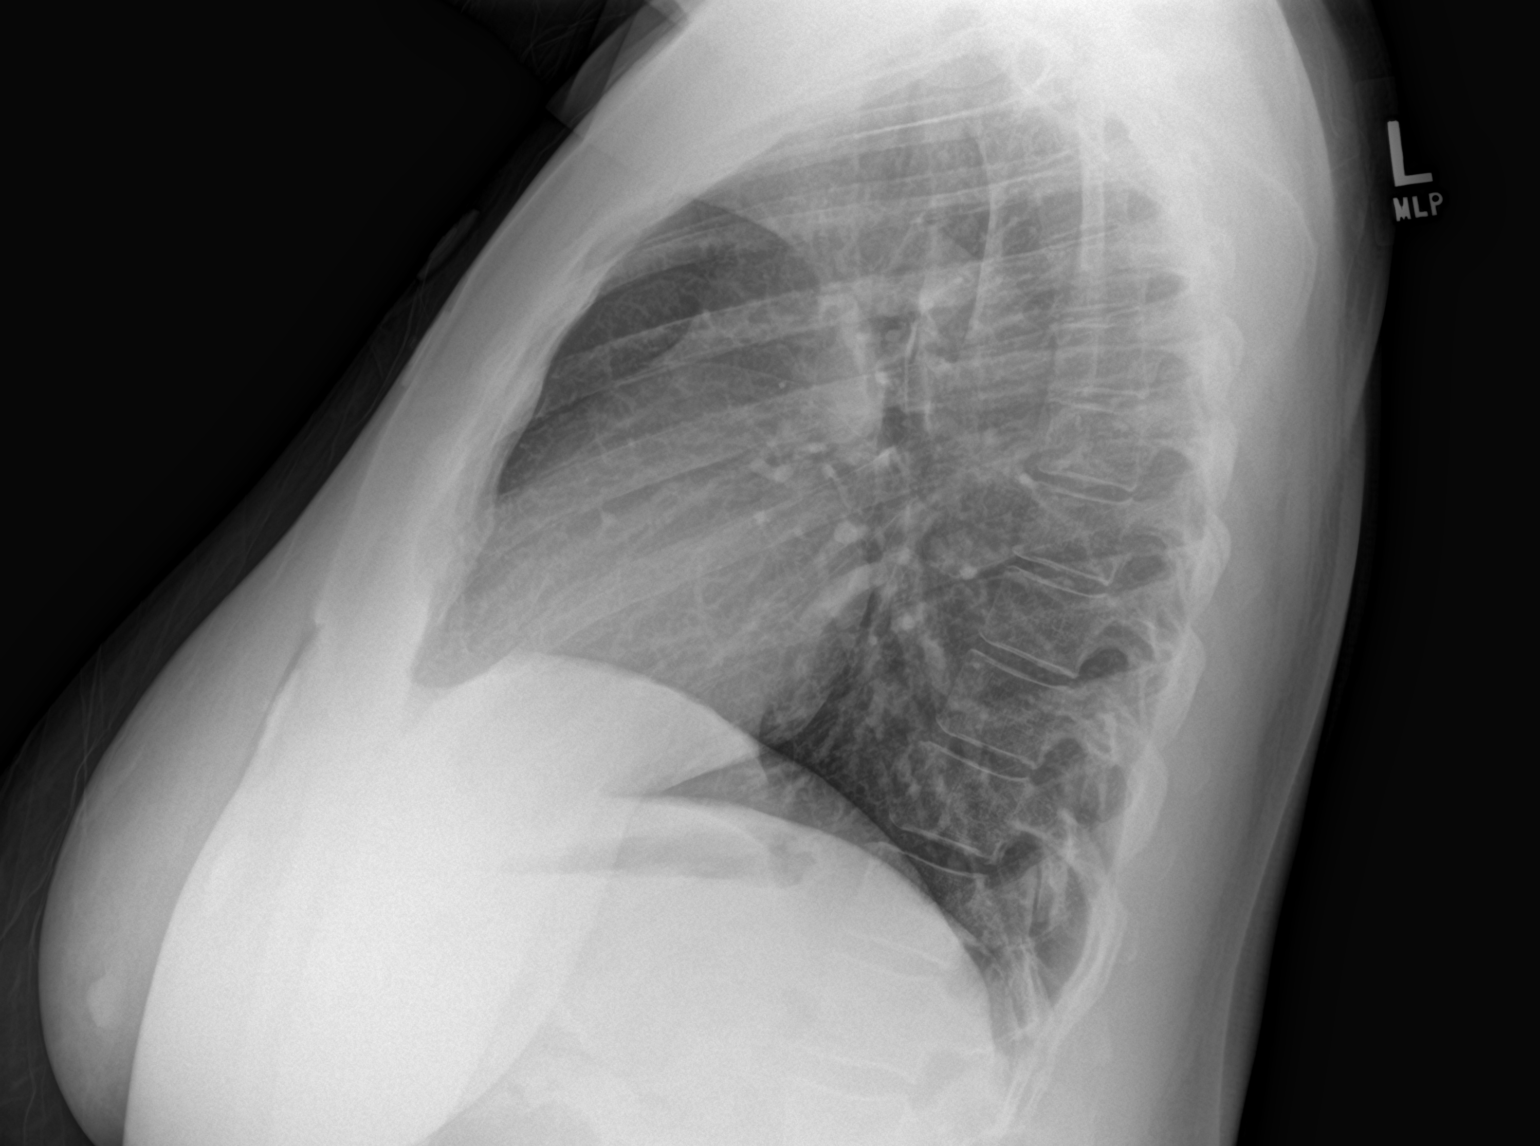

[2 of 2 positions shown; findings below may reference images not displayed]

FINDINGS: The cardiomediastinal contours are normal. The lungs are clear.
Pulmonary vasculature is normal. No consolidation, pleural effusion,
or pneumothorax. No acute osseous abnormalities are seen.
IMPRESSION: Negative radiographs of the chest.

## 2019-11-01 ENCOUNTER — Ambulatory Visit: Payer: 59

## 2020-01-18 ENCOUNTER — Other Ambulatory Visit: Payer: Self-pay

## 2020-01-18 ENCOUNTER — Ambulatory Visit
Admission: EM | Admit: 2020-01-18 | Discharge: 2020-01-18 | Disposition: A | Discharge status: Home / Self Care | Payer: 59

## 2020-01-18 ENCOUNTER — Encounter: Payer: Self-pay | Admitting: *Deleted

## 2020-01-18 DIAGNOSIS — S61213A Laceration without foreign body of left middle finger without damage to nail, initial encounter: Secondary | ICD-10-CM

## 2020-01-18 DIAGNOSIS — W260XXA Contact with knife, initial encounter: Secondary | ICD-10-CM

## 2020-01-18 HISTORY — DX: Anemia, unspecified: D64.9

## 2020-01-18 MED ORDER — LIDOCAINE-EPINEPHRINE-TETRACAINE (LET) TOPICAL GEL
3.0000 mL | Freq: Once | TOPICAL | Status: AC
  Administered 2020-01-18: 3 mL via TOPICAL

## 2020-01-18 NOTE — Discharge Instructions (Signed)
Keep steristrips and finger splint on for 7-10 days. Do not get area wet. You can remove steristrips in 7-10 days when area heals.

## 2020-01-18 NOTE — ED Triage Notes (Signed)
Pt reports cutting a zip tie with kitchen knife approx 20 min ago when she sustained laceration to left middle finger.  Small laceration noted with oozing blood.  CMS intact to finger.

## 2020-01-19 NOTE — ED Provider Notes (Signed)
[facility]    CSN: [id number] Arrival date & time: 01/18/20  1549      History   Chief Complaint Chief Complaint  Patient presents with  . Laceration    HPI Kathleen Levy is a 39 y.o. female.   39 year old female comes in for left middle finger laceration sustained shortly prior to arrival.  States was trying to cut a zip tie with a kitchen knife when she sustained a laceration.  Bleeding controlled with pressure.  Denies numbness, tingling.  Unknown last tetanus.     Past Medical History:  Diagnosis Date  . Anemia     There are no problems to display for this patient.   Past Surgical History:  Procedure Laterality Date  . DILATION AND CURETTAGE OF UTERUS    . INDUCED ABORTION    . TOOTH EXTRACTION    . TUBAL LIGATION      OB History    Gravida  4   Para  2   Term  2   Preterm      AB  2   Living  2     SAB  1   TAB  1   Ectopic      Multiple      Live Births               Home Medications    Prior to Admission medications   Medication Sig Start Date End Date Taking? Authorizing Provider  ferrous sulfate (FER-IN-SOL) 75 (15 Fe) MG/ML SOLN Take 15 mg of iron by mouth daily.   Yes [provider]  Flaxseed, Linseed, (FLAX SEED OIL PO) Take by mouth.   Yes [provider]  Multiple Vitamin (MULTIVITAMIN WITH MINERALS) TABS tablet Take 1 tablet by mouth daily.   Yes [provider]  ibuprofen (ADVIL) 800 MG tablet Take 1 tablet (800 mg total) by mouth 3 (three) times daily. 05/07/19   Ok Edwards, PA-C    Family History Family History  Problem Relation Age of Onset  . Diabetes Mother   . Hypertension Father     Social History Social History   Tobacco Use  . Smoking status: Former Research scientist (life sciences)  . Smokeless tobacco: Never Used  Substance Use Topics  . Alcohol use: Yes    Comment: rarely  . Drug use: No     Allergies   Patient has no known allergies.   Review of Systems Review of Systems   Reason unable to perform ROS: See HPI as above.     Physical Exam Triage Vital Signs ED Triage Vitals  Enc Vitals Group     BP 01/18/20 1554 120/80     Pulse Rate 01/18/20 1554 94     Resp 01/18/20 1554 16     Temp 01/18/20 1554 98.1 F (36.7 C)     Temp Source 01/18/20 1554 Oral     SpO2 01/18/20 1554 95 %     Weight --      Height --      Head Circumference --      Peak Flow --      Pain Score 01/18/20 1557 5     Pain Loc --      Pain Edu? --      Excl. in [place]? --    No data found.  Updated Vital Signs BP 120/80 (BP Location: Left Arm)   Pulse 94   Temp 98.1 F (36.7 C) (Oral)   Resp 16  LMP 01/06/2020 (Exact Date)   SpO2 95%   Physical Exam Constitutional:      General: She is not in acute distress.    Appearance: Normal appearance. She is well-developed. She is not toxic-appearing or diaphoretic.  HENT:     Head: Normocephalic and atraumatic.  Eyes:     Conjunctiva/sclera: Conjunctivae normal.     Pupils: Pupils are equal, round, and reactive to light.  Pulmonary:     Effort: Pulmonary effort is normal. No respiratory distress.     Comments: Speaking in full sentences without difficulty Musculoskeletal:     Cervical back: Normal range of motion and neck supple.     Comments: 1.5cm laceration to the middle phalanx of left middle finger. Bleeding controlled without pressure. Full ROM of finger. NVI  Skin:    General: Skin is warm and dry.  Neurological:     Mental Status: She is alert and oriented to person, place, and time.      UC Treatments / Results  Labs (all labs ordered are listed, but only abnormal results are displayed) Labs Reviewed - No data to display  EKG   Radiology No results found.  Procedures Laceration Repair  Date/Time: 01/19/2020 8:17 AM Performed by: Belinda Fisher, PA-C Authorized by: Belinda Fisher, PA-C   Consent:    Consent obtained:  Verbal   Consent given by:  Patient   Risks discussed:  Infection, pain, poor cosmetic  result and poor wound healing   Alternatives discussed:  No treatment Anesthesia (see MAR for exact dosages):    Anesthesia method:  Topical application   Topical anesthetic:  LET Laceration details:    Location:  Finger   Finger location:  L long finger   Length (cm):  1.5   Depth (mm):  2 Repair type:    Repair type:  Simple Pre-procedure details:    Preparation:  Patient was prepped and draped in usual sterile fashion Exploration:    Hemostasis achieved with:  LET and direct pressure   Wound exploration: wound explored through full range of motion and entire depth of wound probed and visualized     Contaminated: no   Treatment:    Area cleansed with:  Hibiclens   Amount of cleaning:  Standard   Irrigation solution:  Sterile water   Irrigation method:  Tap and syringe   Visualized foreign bodies/material removed: no   Skin repair:    Repair method:  Steri-Strips   Number of Steri-Strips:  3 Approximation:    Approximation:  Close Post-procedure details:    Dressing:  Splint for protection   Patient tolerance of procedure:  Tolerated well, no immediate complications   (including critical care time)  Medications Ordered in UC Medications  lidocaine-EPINEPHrine-tetracaine (LET) topical gel (3 mLs Topical Given 01/18/20 1621)    Initial Impression / Assessment and Plan / UC Course  I have reviewed the triage vital signs and the nursing notes.  Pertinent labs & imaging results that were available during my care of the patient were reviewed by me and considered in my medical decision making (see chart for details).    Discussed laceration repair options including sutures versus Steri-Strips versus Dermabond.  Risks and benefits discussed.  Patient would like to proceed with Steri-Strips.   Patient tolerated procedure well.  3 Steri-Strips applied.  Will splint finger to prevent opening of laceration.  Wound care instructions given.  Return precautions given.  Patient  expresses understanding and agrees to plan.  Final  Clinical Impressions(s) / UC Diagnoses   Final diagnoses:  Laceration of left middle finger without foreign body without damage to nail, initial encounter    ED Prescriptions    None     PDMP not reviewed this encounter.   Belinda Fisher, PA-C 01/19/20 [phone]

## 2020-08-14 ENCOUNTER — Other Ambulatory Visit: Payer: Self-pay

## 2020-08-14 ENCOUNTER — Ambulatory Visit (INDEPENDENT_AMBULATORY_CARE_PROVIDER_SITE_OTHER): Payer: 59

## 2020-08-14 ENCOUNTER — Ambulatory Visit: Payer: 59 | Admitting: Clinical

## 2020-08-14 ENCOUNTER — Other Ambulatory Visit: Payer: 59

## 2020-08-14 DIAGNOSIS — Z8659 Personal history of other mental and behavioral disorders: Secondary | ICD-10-CM

## 2020-08-14 DIAGNOSIS — R5383 Other fatigue: Secondary | ICD-10-CM

## 2020-08-14 DIAGNOSIS — Z01419 Encounter for gynecological examination (general) (routine) without abnormal findings: Secondary | ICD-10-CM

## 2020-08-14 NOTE — Progress Notes (Signed)
CMP and CBC canceled; plan of care changed. See nurse visit note from 08/14/20.

## 2020-08-14 NOTE — Patient Instructions (Signed)
[facility] [address] [place], [place] [address] [phone] (main office) [phone] (Sipriano Fendley's office)   [facility] (Individual and group grief support)   [email or web address]  [phone]   Coping with Panic Attacks   What is a panic attack?  You may have had a panic attack if you experienced four or more of the symptoms listed below coming on abruptly and peaking in about 10 minutes.  Panic Symptoms    Pounding heart   Sweating   Trembling or shaking   Shortness of breath   Feeling of choking   Chest pain   Nausea or abdominal distress     Feeling dizzy, unsteady, lightheaded, or faint   Feelings of unreality or being detached from yourself   Fear of losing control or going crazy   Fear of dying   Numbness or tingling   Chills or hot flashes      Panic attacks are sometimes accompanied by avoidance of certain places or situations. These are often situations that would be difficult to escape from or in which help might not be available. Examples might include crowded shopping malls, public transportation, restaurants, or driving.   Why do panic attacks occur?   Panic attacks are the body's alarm system gone awry. All of [place] have a built-in alarm system, powered by adrenaline, which increases our heart rate, breathing, and blood flow in response to danger. Ordinarily, this 'danger response system' works well. In some people, however, the response is either out of proportion to whatever stress is going on, or may come out of the blue without any stress at all.   For example, if you are walking in the woods and see a bear coming your way, a variety of changes occur in your body to prepare you to either fight the danger or flee from the situation. Your heart rate will increase to get more blood flow around your body, your breathing rate will quicken so that more oxygen is available, and your muscles will tighten  in order to be ready to fight or run. You may feel nauseated as blood flow leaves your stomach area and moves into your limbs. These bodily changes are all essential to helping you survive the dangerous situation. After the danger has passed, your body functions will begin to go back to normal. This is because your body also has a system for "recovering" by bringing your body back down to a normal state when the danger is over.   As you can see, the emergency response system is adaptive when there is, in fact, a "true" or "real" danger (e.g., bear). However, sometimes people find that their emergency response system is triggered in "everyday" situations where there really is no true physical danger (e.g., in a meeting, in the grocery store, while driving in normal traffic, etc.).   What triggers a panic attack?  Sometimes particularly stressful situations can trigger a panic attack. For example, an argument with your spouse or stressors at work can cause a stress response (activating the emergency response system) because you perceive it as threatening or overwhelming, even if there is no direct risk to your survival.  Sometimes panic attacks don't seem to be triggered by anything in particular- they may "come out of the blue". Somehow, the natural "fight or flight" emergency response system has gotten activated when there is no real danger. Why does the body go into "emergency mode" when there is no real danger?  Often, people with panic attacks are frightened or alarmed by the physical sensations of the emergency response system. First, unexpected physical sensations are experienced (tightness in your chest or some shortness of breath). This then leads to feeling fearful or alarmed by these symptoms ("Something's wrong!", "Am I having a heart attack?", "Am I going to faint?") The mind perceives that there is a danger even though no real danger exists. This, in turn, activates the emergency response  system ("fight or flight"), leading to a "full blown" panic attack. In summary, panic attacks occur when we misinterpret physical symptoms as signs of impending death, craziness, loss of control, embarrassment, or fear of fear. Sometimes you may be aware of thoughts of danger that activate the emergency response system (for example, thinking "I'm having a heart attack" when you feel chest pressure or increased heart rate). At other times, however, you may not be aware of such thoughts. After several incidences of being afraid of physical sensations, anxiety and panic can occur in response to the initial sensations without conscious thoughts of danger. Instead, you just feel afraid or alarmed. In other words, the panic or fear may seem to occur "automatically" without you consciously telling yourself anything.   After having had one or more panic attacks, you may also become more focused on what is going on inside your body. You may scan your body and be more vigilant about noticing any symptoms that might signal the start of a panic attack. This makes it easier for panic attacks to happen again because you pick up on sensations you might otherwise not have noticed, and misinterpret them as something dangerous. A panic attack may then result.      How do I cope with panic attacks?  An important part of overcoming panic attacks involves re-interpreting your body's physical reactions and teaching yourself ways to decrease the physical arousal. This can be done through practicing the cognitive and behavioral interventions below.   Research has found that over half of people who have panic attacks show some signs of hyperventilation or overbreathing. This can produce initial sensations that alarm you and lead to a panic attack. Overbreathing can also develop as part of the panic attack and make the symptoms worse. When people hyperventilate, certain blood vessels in the body become narrower. In particular, the  brain may get slightly less oxygen. This can lead to the symptoms of dizziness, confusion, and lightheadedness that often occur during panic attacks. Other parts of the body may also get a bit less oxygen, which may lead to numbness or tingling in the hands or feet or the sensation of cold, clammy hands. It also may lead the heart to pump harder. Although these symptoms may be frightening and feel unpleasant, it is important to remember that hyperventilating is not dangerous. However, you can help overcome the unpleasantness of overbreathing by practicing Breathing Retraining.   Practice this basic technique three times a day, every day:   Inhale. With your shoulders relaxed, inhale as slowly and deeply as you can while you count to six. If you can, use your diaphragm to fill your lungs with air.   Hold. Keep the air in your lungs as you slowly count to four.   Exhale. Slowly breath out as you count to six.   Repeat. Do the inhale-hold-exhale cycle several times. Each time you do it, exhale for longer counts.  Like any new skill, Breathing Retraining requires practice. Try practicing this skill twice a day for  several minutes. Initially, do not try this technique in specific situations or when you become frightened or have a panic attack. Begin by practicing in a quiet environment to build up your skill level so that you can later use it in time of "emergency."   2. Decreasing Avoidance  Regardless of whether you can identify why you began having panic attacks or whether they seemed to come out of the blue, the places where you began having panic attacks often can become triggers themselves. It is not uncommon for individuals to begin to avoid the places where they have had panic attacks. Over time, the individual may begin to avoid more and more places, thereby decreasing their activities and often negatively impacting their quality of life. To break the cycle of avoidance, it is important to first  identify the places or situations that are being avoided, and then to do some "relearning."  To begin this intervention, first create a list of locations or situations that you tend to avoid. Then choose an avoided location or situation that you would like to target first. Now develop an "exposure hierarchy" for this situation or location. An "exposure hierarchy" is a list of actions that make you feel anxious in this situation. Order these actions from least to most anxiety-producing. It is often helpful to have the first item on your hierarchy involve thinking or imagining part of the feared/avoided situation.   Here is an example of an exposure hierarchy for decreasing avoidance of the grocery store. Note how it is ordered from the least amount of anxiety (at the top) to the most anxiety (at the bottom):   Think about going to the grocery store alone.   Go to the grocery store with a friend or family member.   Go to the grocery store alone to pick up a few small items (5-10 minutes in the store).   Shopping for 10-20 minutes in the store alone.   Doing the shopping for the week by myself (20-30 minutes in the store).   Your homework is to "expose" yourself to the lowest item on your hierarchy and use your breathing relaxation and coping statements (see below) to help you remain in the situation. Practice this several times during the upcoming week. Once you have mastered each item with minimal anxiety, move on to the next higher action on your list.   Cognitive Interventions  1. Identify your negative self-talk Anxious thoughts can increase anxiety symptoms and panic. The first step in changing anxious thinking is to identify your own negative, alarming self-talk. Some common alarming thoughts:   I'm having a heart attack.             I must be going crazy.  I think I'm dying.  People will think I'm crazy.  I'm going to pass our.   Oh no- here it comes.   I can't stand this.    I've got to get out of here!  2. Use positive coping statements Changing or disrupting a pattern of anxious thoughts by replacing them with more calming or supportive statements can help to divert a panic attack. Some common helpful coping statements:   This is not an emergency.   I don't like feeling this way, but I can accept it.   I can feel like this and still be okay.   This has happened before, and I was okay. I'll be okay this time, too.   I can be anxious and still deal with  this situation.       /Emotional Wellbeing Apps and Websites Here are a few free apps meant to help you to help yourself.  To find, try searching on the internet to see if the app is offered on Apple/Android devices. If your first choice doesn't come up on your device, the good news is that there are many choices! Play around with different apps to see which ones are helpful to you.    Calm This is an app meant to help increase calm feelings. Includes info, strategies, and tools for tracking your feelings.      Calm Harm  This app is meant to help with self-harm. Provides many 5-minute or 15-min coping strategies for doing instead of hurting yourself.       Healthy Minds Health Minds is a problem-solving tool to help deal with emotions and cope with stress you encounter wherever you are.      MindShift This app can help people cope with anxiety. Rather than trying to avoid anxiety, you can make an important shift and face it.      MY3  MY3 features a support system, safety plan and resources with the goal of offering a tool to use in a time of need.       My Life My Voice  This mood journal offers a simple solution for tracking your thoughts, feelings and moods. Animated emoticons can help identify your mood.       Relax Melodies Designed to help with sleep, on this app you can mix sounds and meditations for relaxation.      Smiling Mind Smiling Mind is meditation made  easy: it's a simple tool that helps put a smile on your mind.        Stop, Breathe & Think  A friendly, simple guide for people through meditations for mindfulness and compassion.  Stop, Breathe and Think Kids Enter your current feelings and choose a mission to help you cope. Offers videos for certain moods instead of just sound recordings.       Team Orange The goal of this tool is to help teens change how they think, act, and react. This app helps you focus on your own good feelings and experiences.      The [employer] The [employer] (V[employer]) contains simple tools to help patients with coping, relaxation, distraction, and positive thinking.

## 2020-08-14 NOTE — Addendum Note (Signed)
Addended by: Maxwell Marion E on: 08/14/2020 11:53 AM   Modules accepted: Orders

## 2020-08-14 NOTE — BH Specialist Note (Signed)
   Integrated Behavioral Health Initial Visit  MRN: [id number] Name: Kathleen Levy  Number of Integrated Behavioral Health Clinician visits:: 1/6 Session Start time: 12:05 Session End time: 12:20 Total time: 15  Type of Service: Integrated Behavioral Health- Individual/Family Interpretor:No. Interpretor Name and Language: n/a   Warm Hand Off Completed.       SUBJECTIVE: Kathleen Levy is a 39 y.o. female accompanied by n/a Patient was referred by Jaynie Collins, MD for positive depression screen. Patient reports the following symptoms/concerns: Pt states her primary concern today is anxiety with panic attacks that have increased in the past year, with 3 family losses (nephew in March, mother in September, uncle in November) and life stress; pt open to learning self-coping strategy today.  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 Reduce symptoms of: anxiety, depression and stress 2. Increase knowledge and/or ability of: self-management skills and stress reduction  3. Demonstrate ability to: Increase healthy adjustment to current life circumstances and Increase adequate support systems for patient/family  INTERVENTIONS: Interventions utilized: Mindfulness or Management consultant, Psychoeducation and/or Health Education and Link to [employer]  Standardized Assessments completed: GAD-7 and PHQ 9  ASSESSMENT: Patient currently experiencing Anxiety disorder and Grief.   Patient may benefit from psychoeducation and brief therapeutic  interventions regarding coping with symptoms of anxiety with panic, depression, grief and life stress .  PLAN: 1. Follow up with behavioral health clinician on : Two weeks 2. Behavioral recommendations:  -CALM relaxation breathing exercise twice daily(morning; at bedtime) -Read educational materials regarding coping with symptoms of anxiety with panic -Consider additional grief support (for self and/or family) via [facility] (on After Visit Summary) -Consider apps for additional self-coping  3. Referral(s): Integrated [employer] (In Clinic) and Community Resources:  grief support  [place], [place]   Depression screen [facility] 2/9 08/14/2020 05/31/2018  Decreased Interest 1 1  Down, Depressed, Hopeless 1 1  PHQ - 2 Score 2 2  Altered sleeping 1 1  Tired, decreased energy 1 1  Change in appetite 1 1  Feeling bad or failure about yourself  1 1  Trouble concentrating 1 1  Moving slowly or fidgety/restless 1 0  Suicidal thoughts 0 0  PHQ-9 Score 8 7   GAD 7 : Generalized Anxiety Score 08/14/2020 05/31/2018  Nervous, Anxious, on Edge 2 3  Control/stop worrying 1 2  Worry too much - different things 1 2  Trouble relaxing 1 2  Restless 1 0  Easily annoyed or irritable 2 3  Afraid - awful might happen 2 1  Total GAD 7 Score 10 13

## 2020-08-14 NOTE — Progress Notes (Signed)
Pt here today for self-scheduled lab visit. Pt asking lab tech multiple health questions. RN to room for nurse visit. Pt reports ongoing feelings of fatigue, denies having PCP, last OB/GYN visit was 05/31/18. Pt reports ongoing anxiety and depression; reports recent occurrence of panic attacks. PHQ-9 and GAD-7 given. Per chart review, referral placed to [facility] 05/31/2018. [facility] agrees to see patient today. Information given to pt for [facility] Program to follow up regarding desired labs and complaint of fatigue. Encouraged pt to schedule annual visit with GYN provider. Warm hand off to [facility] completed.   Fleet Contras RN 08/14/20

## 2020-08-16 NOTE — Progress Notes (Signed)
Patient was assessed and managed by nursing staff during this encounter. I have reviewed the chart and agree with the documentation and plan. I have also made any necessary editorial changes. ° °Tykiera Raven, MD °08/16/2020 1:51 PM °

## 2020-08-18 NOTE — BH Specialist Note (Signed)
Integrated Behavioral Health via Telemedicine Video [facility]) Visit  08/18/2020 Kathleen Levy [id number]  Number of Integrated Behavioral Health visits: 2 Session Start time: 9:19  Session End time: 9:53 Total time: 34 minutes  Referring Provider: Jaynie Collins, MD Type of Service: Individual Patient/Family location: Home [facility] Provider [facility]: [facility]  All persons participating in visit: Patient Kathleen Levy and [facility]   I connected with Kathleen Levy  by a video enabled telemedicine application ([employer]) and verified that I am speaking with the correct person using two identifiers.   Discussed confidentiality: Yes   Confirmed demographics & insurance:  Yes   I discussed that engaging in this virtual visit, they consent to the provision of behavioral healthcare and the services will be billed under their insurance.   Patient and/or legal guardian expressed understanding and consented to virtual visit: Yes   PRESENTING CONCERNS: Patient and/or family reports the following symptoms/concerns: Pt states her primary concerns today are fatigue, forgetting to take her vitamins, anxious about getting first mammogram, forgetting to eat, and father's recent cancer diagnosis; pt smokes cigars (one pack/week) to cope with stress. Pt requests mobile clinic schedule.  Duration of problem: Ongoing, with increase in past year; Severity of problem: moderate  STRENGTHS (Protective Factors/Coping Skills): Social and Emotional competence  ASSESSMENT: Patient currently experiencing Anxiety disorder and Grief .    GOALS ADDRESSED: Patient will: 1.  Reduce symptoms of: anxiety, depression and stress  2.  Demonstrate ability to: Increase healthy adjustment to current life circumstances and Increase motivation to adhere to plan of care   Progress of Goals: Ongoing  INTERVENTIONS: Interventions utilized:   Solution-Focused Strategies and Psychoeducation and/or Health Education Standardized Assessments completed & reviewed: Not Needed   OUTCOME: Patient Response: Pt agrees to revised treatment plan   PLAN: 1. Follow up with behavioral health clinician on : As needed 2. Behavioral recommendations:  -Set timer on phone as reminder to take vitamins -Continue with plan to establish with PCP via Mobile [facility] (plan to obtain labwork to find any vitamin deficiencies contributing to fatigue; talk to PCP about medication to treat anxiety) -Continue with plan to attend Mammogram appointment on 09/08/20  -Continue using one pack/week or less cigars; consider using relaxation breathing exercise and/or apps to replace one cigar/day on low-stress days -Continue to consider additional grief support (on last visit AVS) as needed for self and/or family 3. Referral(s): Integrated [employer] (In Clinic)  I discussed the assessment and treatment plan with the patient and/or parent/guardian. They were provided an opportunity to ask questions and all were answered. They agreed with the plan and demonstrated an understanding of the instructions.   They were advised to call back or seek an in-person evaluation as appropriate.  I discussed that the purpose of this visit is to provide behavioral health care while limiting exposure to the novel coronavirus.  Discussed there is a possibility of technology failure and discussed alternative modes of communication if that failure occurs.  Valetta Close Daronte Shostak

## 2020-08-31 ENCOUNTER — Other Ambulatory Visit: Payer: Self-pay | Admitting: Obstetrics & Gynecology

## 2020-08-31 ENCOUNTER — Ambulatory Visit (INDEPENDENT_AMBULATORY_CARE_PROVIDER_SITE_OTHER): Payer: 59 | Admitting: Clinical

## 2020-08-31 DIAGNOSIS — F4321 Adjustment disorder with depressed mood: Secondary | ICD-10-CM

## 2020-08-31 DIAGNOSIS — F419 Anxiety disorder, unspecified: Secondary | ICD-10-CM

## 2020-08-31 DIAGNOSIS — Z1231 Encounter for screening mammogram for malignant neoplasm of breast: Secondary | ICD-10-CM

## 2020-09-08 ENCOUNTER — Other Ambulatory Visit: Payer: Self-pay

## 2020-09-08 ENCOUNTER — Ambulatory Visit
Admission: RE | Admit: 2020-09-08 | Discharge: 2020-09-08 | Disposition: A | Discharge status: Home / Self Care | Payer: 59 | Source: Ambulatory Visit

## 2020-09-08 DIAGNOSIS — Z1231 Encounter for screening mammogram for malignant neoplasm of breast: Secondary | ICD-10-CM

## 2020-09-30 ENCOUNTER — Other Ambulatory Visit: Payer: Self-pay

## 2020-09-30 ENCOUNTER — Ambulatory Visit
Admission: EM | Admit: 2020-09-30 | Discharge: 2020-09-30 | Disposition: A | Discharge status: Home / Self Care | Payer: 59 | Attending: Internal Medicine | Admitting: Internal Medicine

## 2020-09-30 ENCOUNTER — Telehealth: Payer: 59 | Admitting: Emergency Medicine

## 2020-09-30 DIAGNOSIS — M5481 Occipital neuralgia: Secondary | ICD-10-CM | POA: Diagnosis not present

## 2020-09-30 DIAGNOSIS — J329 Chronic sinusitis, unspecified: Secondary | ICD-10-CM

## 2020-09-30 MED ORDER — METAXALONE 800 MG PO TABS: 800.0000 mg | ORAL_TABLET | Freq: Three times a day (TID) | ORAL | 0 refills | Status: DC

## 2020-09-30 MED ORDER — IPRATROPIUM BROMIDE 0.03 % NA SOLN: 2.0000 | Freq: Two times a day (BID) | NASAL | 0 refills | Status: DC

## 2020-09-30 NOTE — ED Provider Notes (Signed)
[facility]    CSN: [id number] Arrival date & time: 09/30/20  1214      History   Chief Complaint Chief Complaint  Patient presents with  . Headache    Lower left part of the back of the head    HPI Kathleen Levy is a 39 y.o. female who is here due to having pain on the L occipiral head x 3 days. Pain is described as sharp and intermittent q 2 minutes. Excedrin today felt less tense. Had been taking Motrin and Tylenol. Nothing made it worse or better. She missed work due to this.  Was able to sleep ok. While pain occurred  Denies nausea or photophobia. Has been stressed but did not feel more the past 3 days and did not fall asleep with head hanging or a different bed the day before this started.     Past Medical History:  Diagnosis Date  . Anemia     There are no problems to display for this patient.   Past Surgical History:  Procedure Laterality Date  . DILATION AND CURETTAGE OF UTERUS    . INDUCED ABORTION    . TOOTH EXTRACTION    . TUBAL LIGATION      OB History    Gravida  4   Para  2   Term  2   Preterm      AB  2   Living  2     SAB  1   IAB  1   Ectopic      Multiple      Live Births               Home Medications    Prior to Admission medications   Medication Sig Start Date End Date Taking? Authorizing Provider  ferrous sulfate (FER-IN-SOL) 75 (15 Fe) MG/ML SOLN Take 15 mg of iron by mouth daily.   Yes [provider]  Flaxseed, Linseed, (FLAX SEED OIL PO) Take by mouth.   Yes [provider]  ibuprofen (ADVIL) 800 MG tablet Take 1 tablet (800 mg total) by mouth 3 (three) times daily. 05/07/19  Yes Yu, Amy V, PA-C  ipratropium (ATROVENT) 0.03 % nasal spray Place 2 sprays into both nostrils every 12 (twelve) hours. 09/30/20  Yes Roxy Horseman, PA-C  Multiple Vitamin (MULTIVITAMIN WITH MINERALS) TABS tablet Take 1 tablet by mouth daily.   Yes [provider]    Family History Family  History  Problem Relation Age of Onset  . Diabetes Mother   . Hypertension Father   . Breast cancer Paternal Aunt     Social History Social History   Tobacco Use  . Smoking status: Former Games developer  . Smokeless tobacco: Never Used  Vaping Use  . Vaping Use: Never used  Substance Use Topics  . Alcohol use: Yes    Comment: rarely  . Drug use: No     Allergies   Patient has no known allergies.   Review of Systems Review of Systems  Constitutional: Negative for chills, diaphoresis and fever.  HENT: Negative for congestion, ear discharge and ear pain.   Eyes: Negative for photophobia, discharge and visual disturbance.  Respiratory: Negative for cough.   Gastrointestinal: Negative for diarrhea, nausea and vomiting.  Musculoskeletal: Positive for neck pain and neck stiffness. Negative for arthralgias, gait problem and myalgias.  Skin: Negative for rash.  Neurological: Positive for headaches. Negative for dizziness, light-headedness and numbness.  Hematological: Positive for adenopathy.  Physical Exam Triage Vital Signs ED Triage Vitals  Enc Vitals Group     BP 09/30/20 1634 116/82     Pulse Rate 09/30/20 1634 (!) 57     Resp 09/30/20 1634 20     Temp 09/30/20 1634 98 F (36.7 C)     Temp Source 09/30/20 1634 Oral     SpO2 09/30/20 1634 100 %     Weight --      Height --      Head Circumference --      Peak Flow --      Pain Score 09/30/20 1657 0     Pain Loc --      Pain Edu? --      Excl. in GC? --    No data found.  Updated Vital Signs BP 116/82 (BP Location: Right Arm)   Pulse (!) 57   Temp 98 F (36.7 C) (Oral)   Resp 20   SpO2 100%   Visual Acuity Right Eye Distance:   Left Eye Distance:   Bilateral Distance:    Right Eye Near:   Left Eye Near:    Bilateral Near:     Physical Exam Vitals signs and nursing note reviewed.  Constitutional:      General: He is not in acute distress.    Appearance: He is well-developed and normal weight. He  is not ill-appearing, toxic-appearing or diaphoretic.  HENT:     Head: Normocephalic. Has point tenderness on L occipital trap insertion which is where she has been feeling the pain. The L mid trap is tense.  Eyes:     Extraocular Movements: Extraocular movements intact.     Pupils: Pupils are equal, round, and reactive to light.  Neck:     Musculoskeletal: Neck supple. No neck rigidity.     Meningeal: Brudzinski's sign absent.  Cardiovascular:     Rate and Rhythm: Normal rate and regular rhythm.     Heart sounds: No murmur.  Pulmonary:     Effort: Pulmonary effort is normal.     Breath sounds: Normal breath sounds. No wheezing, rhonchi or rales.  Abdominal:     General: Bowel sounds are normal.     Palpations: Abdomen is soft. There is no mass.     Tenderness: There is no abdominal tenderness. There is no guarding.  Musculoskeletal: Normal range of motion.  Lymphadenopathy:     Cervical: No cervical adenopathy.  Skin:    General: Skin is warm and dry.  Neurological:     Mental Status: He is alert.     Cranial Nerves: No cranial nerve deficit or facial asymmetry.     Sensory: No sensory deficit.     Motor: No weakness.     Coordination: Romberg sign negative. Coordination normal.     Gait: Gait normal.     Deep Tendon Reflexes: Reflexes normal.     Comments: Normal Romberg, propioception, finger to nose, tandem gait.   Psychiatric:        Mood and Affect: Mood normal.        Speech: Speech normal.        Behavior: Behavior normal.     UC Treatments / Results  Labs (all labs ordered are listed, but only abnormal results are displayed) Labs Reviewed - No data to display  EKG   Radiology No results found.  Procedures Procedures (including critical care time)  Medications Ordered in UC Medications - No data to display  Initial Impression /  Assessment and Plan / UC Course  I have reviewed the triage vital signs and the nursing notes. Seem to be having occipital  neuralgia type HA and she will continue Excedrin and I added Skelaxin as noted.  See instructions Final Clinical Impressions(s) / UC Diagnoses   Final diagnoses:  None   Discharge Instructions   None    ED Prescriptions    None     PDMP not reviewed this encounter.   Garey Ham, Cordelia Poche 09/30/20 2128

## 2020-09-30 NOTE — ED Triage Notes (Signed)
Patient states she has had pain in the lower left back of her head just above the nape of her neck x 3 days. Pt is aox4 and ambulatory.

## 2020-09-30 NOTE — Progress Notes (Signed)

## 2020-09-30 NOTE — Discharge Instructions (Signed)
Apply ice and heat 15 minutes each 4 times a day and do neck stretches after the heat

## 2020-10-29 ENCOUNTER — Encounter: Payer: Self-pay | Admitting: Internal Medicine

## 2020-10-29 ENCOUNTER — Telehealth (INDEPENDENT_AMBULATORY_CARE_PROVIDER_SITE_OTHER): Payer: 59 | Admitting: Internal Medicine

## 2020-10-29 VITALS — Ht 63.5 in | Wt 206.0 lb

## 2020-10-29 DIAGNOSIS — Z130 Encounter for screening for diseases of the blood and blood-forming organs and certain disorders involving the immune mechanism: Secondary | ICD-10-CM | POA: Diagnosis not present

## 2020-10-29 DIAGNOSIS — E559 Vitamin D deficiency, unspecified: Secondary | ICD-10-CM | POA: Diagnosis not present

## 2020-10-29 DIAGNOSIS — Z13228 Encounter for screening for other metabolic disorders: Secondary | ICD-10-CM

## 2020-10-29 DIAGNOSIS — Z7689 Persons encountering health services in other specified circumstances: Secondary | ICD-10-CM | POA: Diagnosis not present

## 2020-10-29 NOTE — Progress Notes (Signed)
Virtual Visit via Telephone Note  I connected with Kathleen Levy, on 10/29/2020 at 1:49 PM by telephone due to the COVID-19 pandemic and verified that I am speaking with the correct person using two identifiers.   Consent: I discussed the limitations, risks, security and privacy concerns of performing an evaluation and management service by telephone and the availability of in person appointments. I also discussed with the patient that there may be a patient responsible charge related to this service. The patient expressed understanding and agreed to proceed.   Location of Patient: Home   Location of Provider: Clinic    Persons participating in Telemedicine visit: BELLAMY RUBEY Denzil Magnuson Dr. Earlene Plater    History of Present Illness: Patient has a visit to establish care. No recent PCP. No significant PMH. No surgical history. No current medications. Has some concerns that she might have anemia again and has also had low Vit D in the past. Would like to be monitored.    Past Medical History:  Diagnosis Date  . Anemia    No Known Allergies  Current Outpatient Medications on File Prior to Visit  Medication Sig Dispense Refill  . Black Currant Seed Oil 500 MG CAPS Take by mouth daily.    . ferrous sulfate (FER-IN-SOL) 75 (15 Fe) MG/ML SOLN Take 15 mg of iron by mouth daily.    Marland Kitchen ibuprofen (ADVIL) 800 MG tablet Take 1 tablet (800 mg total) by mouth 3 (three) times daily. 21 tablet 0  . ipratropium (ATROVENT) 0.03 % nasal spray Place 2 sprays into both nostrils every 12 (twelve) hours. (Patient taking differently: Place 2 sprays into both nostrils every 12 (twelve) hours as needed.) 30 mL 0  . metaxalone (SKELAXIN) 800 MG tablet Take 1 tablet (800 mg total) by mouth 3 (three) times daily. (Patient taking differently: Take 800 mg by mouth 3 (three) times daily as needed.) 21 tablet 0  . Multiple Vitamin (MULTIVITAMIN WITH MINERALS) TABS tablet Take 1 tablet by mouth daily.    .  Flaxseed, Linseed, (FLAX SEED OIL PO) Take by mouth. (Patient not taking: Reported on 10/29/2020)     No current facility-administered medications on file prior to visit.    Observations/Objective: NAD. Speaking clearly.  Work of breathing normal.  Alert and oriented. Mood appropriate.   Assessment and Plan: 1. Encounter to establish care Reviewed patient's PMH, social history, surgical history, and medications.   2. Screening for metabolic disorder - Comprehensive metabolic panel; Future - Lipid panel; Future  3. Screening for deficiency anemia - CBC; Future  4. Vitamin D deficiency - VITAMIN D 25 Hydroxy (Vit-D Deficiency, Fractures); Future   Follow Up Instructions: Call back for lab visit based on schedule; annual exam    I discussed the assessment and treatment plan with the patient. The patient was provided an opportunity to ask questions and all were answered. The patient agreed with the plan and demonstrated an understanding of the instructions.   The patient was advised to call back or seek an in-person evaluation if the symptoms worsen or if the condition fails to improve as anticipated.     I provided 6 minutes total of non-face-to-face time during this encounter including median intraservice time, reviewing previous notes, investigations, ordering medications, medical decision making, coordinating care and patient verbalized understanding at the end of the visit.    Marcy Siren, D.O. [facility]  10/29/2020, 1:49 PM

## 2020-12-03 ENCOUNTER — Ambulatory Visit: Payer: 59 | Admitting: Physician Assistant

## 2020-12-03 ENCOUNTER — Other Ambulatory Visit: Payer: Self-pay

## 2020-12-03 VITALS — BP 109/68 | HR 70 | Temp 97.2°F | Resp 18 | Ht 64.0 in | Wt 206.0 lb

## 2020-12-03 DIAGNOSIS — Z13228 Encounter for screening for other metabolic disorders: Secondary | ICD-10-CM

## 2020-12-03 DIAGNOSIS — E559 Vitamin D deficiency, unspecified: Secondary | ICD-10-CM | POA: Diagnosis not present

## 2020-12-03 DIAGNOSIS — E785 Hyperlipidemia, unspecified: Secondary | ICD-10-CM

## 2020-12-03 DIAGNOSIS — M5441 Lumbago with sciatica, right side: Secondary | ICD-10-CM | POA: Diagnosis not present

## 2020-12-03 MED ORDER — IBUPROFEN 800 MG PO TABS: 800.0000 mg | ORAL_TABLET | Freq: Three times a day (TID) | ORAL | 0 refills | Status: DC | PRN

## 2020-12-03 MED ORDER — KETOROLAC TROMETHAMINE 60 MG/2ML IM SOLN
60.0000 mg | Freq: Once | INTRAMUSCULAR | Status: AC
  Administered 2020-12-03: 60 mg via INTRAMUSCULAR

## 2020-12-03 MED ORDER — METHYLPREDNISOLONE ACETATE 80 MG/ML IJ SUSP
80.0000 mg | Freq: Once | INTRAMUSCULAR | Status: AC
  Administered 2020-12-03: 80 mg via INTRAMUSCULAR

## 2020-12-03 MED ORDER — CYCLOBENZAPRINE HCL 10 MG PO TABS: 10.0000 mg | ORAL_TABLET | Freq: Three times a day (TID) | ORAL | 0 refills | Status: DC | PRN

## 2020-12-03 NOTE — Patient Instructions (Signed)
I encourage you to use ibuprofen, muscle relaxers as needed.  I encourage you to use icing, gentle stretching, increase your hydration.  Once your symptoms have resolved.  I encourage you to begin a daily stretching routine to help prevent future flareups.  We will call you with your lab results.  Please let us know if there is anything else we can do for you, I hope that you feel better soon.  Roney Jaffe, PA-C Physician Assistant [facility] Medicine [email or web address]    Sciatica  Sciatica is pain, numbness, weakness, or tingling along the path of the sciatic nerve. The sciatic nerve starts in the lower back and runs down the back of each leg. The nerve controls the muscles in the lower leg and in the back of the knee. It also provides feeling (sensation) to the back of the thigh, the lower leg, and the sole of the foot. Sciatica is a symptom of another medical condition that pinches or puts pressure on the sciatic nerve. Sciatica most often only affects one side of the body. Sciatica usually goes away on its own or with treatment. In some cases, sciatica may come back (recur). What are the causes? This condition is caused by pressure on the sciatic nerve or pinching of the nerve. This may be the result of:  A disk in between the bones of the spine bulging out too far (herniated disk).  Age-related changes in the spinal disks.  A pain disorder that affects a muscle in the buttock.  Extra bone growth near the sciatic nerve.  A break (fracture) of the pelvis.  Pregnancy.  Tumor. This is rare. What increases the risk? The following factors may make you more likely to develop this condition:  Playing sports that place pressure or stress on the spine.  Having poor strength and flexibility.  A history of back injury or surgery.  Sitting for long periods of time.  Doing activities that involve repetitive bending or  lifting.  Obesity. What are the signs or symptoms? Symptoms can vary from mild to very severe, and they may include:  Any of these problems in the lower back, leg, hip, or buttock: ? Mild tingling, numbness, or dull aches. ? Burning sensations. ? Sharp pains.  Numbness in the back of the calf or the sole of the foot.  Leg weakness.  Severe back pain that makes movement difficult. Symptoms may get worse when you cough, sneeze, or laugh, or when you sit or stand for long periods of time. How is this diagnosed? This condition may be diagnosed based on:  Your symptoms and medical history.  A physical exam.  Blood tests.  Imaging tests, such as: ? X-rays. ? MRI. ? CT scan. How is this treated? In many cases, this condition improves on its own without treatment. However, treatment may include:  Reducing or modifying physical activity.  Exercising and stretching.  Icing and applying heat to the affected area.  Medicines that help to: ? Relieve pain and swelling. ? Relax your muscles.  Injections of medicines that help to relieve pain, irritation, and inflammation around the sciatic nerve (steroids).  Surgery. Follow these instructions at home: Medicines  Take over-the-counter and prescription medicines only as told by your health care provider.  Ask your health care provider if the medicine prescribed to you: ? Requires you to avoid driving or using heavy machinery. ? Can cause constipation. You may need to take these actions to prevent or treat constipation:  Drink  enough fluid to keep your urine pale yellow.  Take over-the-counter or prescription medicines.  Eat foods that are high in fiber, such as beans, whole grains, and fresh fruits and vegetables.  Limit foods that are high in fat and processed sugars, such as fried or sweet foods. Managing pain  If directed, put ice on the affected area. ? Put ice in a plastic bag. ? Place a towel between your skin  and the bag. ? Leave the ice on for 20 minutes, 2-3 times a day.  If directed, apply heat to the affected area. Use the heat source that your health care provider recommends, such as a moist heat pack or a heating pad. ? Place a towel between your skin and the heat source. ? Leave the heat on for 20-30 minutes. ? Remove the heat if your skin turns bright red. This is especially important if you are unable to feel pain, heat, or cold. You may have a greater risk of getting burned.      Activity  Return to your normal activities as told by your health care provider. Ask your health care provider what activities are safe for you.  Avoid activities that make your symptoms worse.  Take brief periods of rest throughout the day. ? When you rest for longer periods, mix in some mild activity or stretching between periods of rest. This will help to prevent stiffness and pain. ? Avoid sitting for long periods of time without moving. Get up and move around at least one time each hour.  Exercise and stretch regularly, as told by your health care provider.  Do not lift anything that is heavier than 10 lb (4.5 kg) while you have symptoms of sciatica. When you do not have symptoms, you should still avoid heavy lifting, especially repetitive heavy lifting.  When you lift objects, always use proper lifting technique, which includes: ? Bending your knees. ? Keeping the load close to your body. ? Avoiding twisting.   General instructions  Maintain a healthy weight. Excess weight puts extra stress on your back.  Wear supportive, comfortable shoes. Avoid wearing high heels.  Avoid sleeping on a mattress that is too soft or too hard. A mattress that is firm enough to support your back when you sleep may help to reduce your pain.  Keep all follow-up visits as told by your health care provider. This is important. Contact a health care provider if:  You have pain that: ? Wakes you up when you are  sleeping. ? Gets worse when you lie down. ? Is worse than you have experienced in the past. ? Lasts longer than 4 weeks.  You have an unexplained weight loss. Get help right away if:  You are not able to control when you urinate or have bowel movements (incontinence).  You have: ? Weakness in your lower back, pelvis, buttocks, or legs that gets worse. ? Redness or swelling of your back. ? A burning sensation when you urinate. Summary  Sciatica is pain, numbness, weakness, or tingling along the path of the sciatic nerve.  This condition is caused by pressure on the sciatic nerve or pinching of the nerve.  Sciatica can cause pain, numbness, or tingling in the lower back, legs, hips, and buttocks.  Treatment often includes rest, exercise, medicines, and applying ice or heat. This information is not intended to replace advice given to you by your health care provider. Make sure you discuss any questions you have with your health care  provider. Document Revised: 10/08/2018 Document Reviewed: 10/08/2018 Elsevier Patient Education  2021 [employer]    Sciatica Rehab Ask your health care provider which exercises are safe for you. Do exercises exactly as told by your health care provider and adjust them as directed. It is normal to feel mild stretching, pulling, tightness, or discomfort as you do these exercises. Stop right away if you feel sudden pain or your pain gets worse. Do not begin these exercises until told by your health care provider. Stretching and range-of-motion exercises These exercises warm up your muscles and joints and improve the movement and flexibility of your hips and back. These exercises also help to relieve pain, numbness, and tingling. Sciatic nerve glide 1. Sit in a chair with your head facing down toward your chest. Place your hands behind your back. Let your shoulders slump forward. 2. Slowly straighten one of your legs while you tilt your head back as if you  are looking toward the ceiling. Only straighten your leg as far as you can without making your symptoms worse. 3. Hold this position for __________ seconds. 4. Slowly return to the starting position. 5. Repeat with your other leg. Repeat __________ times. Complete this exercise __________ times a day. Knee to chest with hip adduction and internal rotation 1. Lie on your back on a firm surface with both legs straight. 2. Bend one of your knees and move it up toward your chest until you feel a gentle stretch in your lower back and buttock. Then, move your knee toward the shoulder that is on the opposite side from your leg. This is hip adduction and internal rotation. ? Hold your leg in this position by holding on to the front of your knee. 3. Hold this position for __________ seconds. 4. Slowly return to the starting position. 5. Repeat with your other leg. Repeat __________ times. Complete this exercise __________ times a day.   Prone extension on elbows 1. Lie on your abdomen on a firm surface. A bed may be too soft for this exercise. 2. Prop yourself up on your elbows. 3. Use your arms to help lift your chest up until you feel a gentle stretch in your abdomen and your lower back. ? This will place some of your body weight on your elbows. If this is uncomfortable, try stacking pillows under your chest. ? Your hips should stay down, against the surface that you are lying on. Keep your hip and back muscles relaxed. 4. Hold this position for __________ seconds. 5. Slowly relax your upper body and return to the starting position. Repeat __________ times. Complete this exercise __________ times a day.   Strengthening exercises These exercises build strength and endurance in your back. Endurance is the ability to use your muscles for a long time, even after they get tired. Pelvic tilt This exercise strengthens the muscles that lie deep in the abdomen. 1. Lie on your back on a firm surface. Bend  your knees and keep your feet flat on the floor. 2. Tense your abdominal muscles. Tip your pelvis up toward the ceiling and flatten your lower back into the floor. ? To help with this exercise, you may place a small towel under your lower back and try to push your back into the towel. 3. Hold this position for __________ seconds. 4. Let your muscles relax completely before you repeat this exercise. Repeat __________ times. Complete this exercise __________ times a day. Alternating arm and leg raises 1. Get on your  hands and knees on a firm surface. If you are on a hard floor, you may want to use padding, such as an exercise mat, to cushion your knees. 2. Line up your arms and legs. Your hands should be directly below your shoulders, and your knees should be directly below your hips. 3. Lift your left leg behind you. At the same time, raise your right arm and straighten it in front of you. ? Do not lift your leg higher than your hip. ? Do not lift your arm higher than your shoulder. ? Keep your abdominal and back muscles tight. ? Keep your hips facing the ground. ? Do not arch your back. ? Keep your balance carefully, and do not hold your breath. 4. Hold this position for __________ seconds. 5. Slowly return to the starting position. 6. Repeat with your right leg and your left arm. Repeat __________ times. Complete this exercise __________ times a day.   Posture and body mechanics Good posture and healthy body mechanics can help to relieve stress in your body's tissues and joints. Body mechanics refers to the movements and positions of your body while you do your daily activities. Posture is part of body mechanics. Good posture means:  Your spine is in its natural S-curve position (neutral).  Your shoulders are pulled back slightly.  Your head is not tipped forward. Follow these guidelines to improve your posture and body mechanics in your everyday activities. Standing  When standing,  keep your spine neutral and your feet about hip width apart. Keep a slight bend in your knees. Your ears, shoulders, and hips should line up.  When you do a task in which you stand in one place for a long time, place one foot up on a stable object that is 2-4 inches (5-10 cm) high, such as a footstool. This helps keep your spine neutral.   Sitting  When sitting, keep your spine neutral and keep your feet flat on the floor. Use a footrest, if necessary, and keep your thighs parallel to the floor. Avoid rounding your shoulders, and avoid tilting your head forward.  When working at a desk or a computer, keep your desk at a height where your hands are slightly lower than your elbows. Slide your chair under your desk so you are close enough to maintain good posture.  When working at a computer, place your monitor at a height where you are looking straight ahead and you do not have to tilt your head forward or downward to look at the screen.   Resting  When lying down and resting, avoid positions that are most painful for you.  If you have pain with activities such as sitting, bending, stooping, or squatting, lie in a position in which your body does not bend very much. For example, avoid curling up on your side with your arms and knees near your chest (fetal position).  If you have pain with activities such as standing for a long time or reaching with your arms, lie with your spine in a neutral position and bend your knees slightly. Try the following positions: ? Lying on your side with a pillow between your knees. ? Lying on your back with a pillow under your knees. Lifting  When lifting objects, keep your feet at least shoulder width apart and tighten your abdominal muscles.  Bend your knees and hips and keep your spine neutral. It is important to lift using the strength of your legs, not your back. Do  not lock your knees straight out.  Always ask for help to lift heavy or awkward objects.    This information is not intended to replace advice given to you by your health care provider. Make sure you discuss any questions you have with your health care provider. Document Revised: 01/11/2019 Document Reviewed: 10/11/2018 Elsevier Patient Education  2021 [employer].

## 2020-12-03 NOTE — Progress Notes (Signed)
Established Patient Office Visit  Subjective:  Patient ID: Kathleen Levy, female    DOB: 03-23-1981  Age: 40 y.o. MRN: [id number]  CC:  Chief Complaint  Patient presents with  . Back Pain    Lower    HPI [name] reports that she has been having lower right back pain with sharp pains and radiation into her right leg since yesterday morning.  Reports that she has to lift at work, and believes this is how she injured her back.  Reports that she does have a history of sciatica and that this feels very similar.  Reports that she has tried Tylenol, muscle relaxers from her previous prescription, and a heating pad without much relief.  Denies saddle anesthesia, numbness or tingling.   Past Medical History:  Diagnosis Date  . Anemia     Past Surgical History:  Procedure Laterality Date  . DILATION AND CURETTAGE OF UTERUS    . INDUCED ABORTION    . TOOTH EXTRACTION    . TUBAL LIGATION      Family History  Problem Relation Age of Onset  . Diabetes Mother   . Hypertension Father   . Breast cancer Paternal Aunt     Social History   Socioeconomic History  . Marital status: Single    Spouse name: Not on file  . Number of children: Not on file  . Years of education: Not on file  . Highest education level: Not on file  Occupational History  . Not on file  Tobacco Use  . Smoking status: Current Some Day Smoker  . Smokeless tobacco: Never Used  . Tobacco comment: Cloves   Vaping Use  . Vaping Use: Never used  Substance and Sexual Activity  . Alcohol use: Yes    Comment: rarely  . Drug use: No  . Sexual activity: Yes    Birth control/protection: Surgical  Other Topics Concern  . Not on file  Social History Narrative  . Not on file   Social Determinants of Health   Financial Resource Strain: Not on file  Food Insecurity: Not on file  Transportation Needs: Not on file  Physical Activity: Not on file  Stress: Not on file  Social Connections: Not on file   Intimate Partner Violence: Not on file    Outpatient Medications Prior to Visit  Medication Sig Dispense Refill  . Black Currant Seed Oil 500 MG CAPS Take by mouth daily.    . ferrous sulfate (FER-IN-SOL) 75 (15 Fe) MG/ML SOLN Take 15 mg of iron by mouth daily.    Marland Kitchen ipratropium (ATROVENT) 0.03 % nasal spray Place 2 sprays into both nostrils every 12 (twelve) hours. (Patient taking differently: Place 2 sprays into both nostrils every 12 (twelve) hours as needed.) 30 mL 0  . metaxalone (SKELAXIN) 800 MG tablet Take 1 tablet (800 mg total) by mouth 3 (three) times daily. (Patient taking differently: Take 800 mg by mouth 3 (three) times daily as needed.) 21 tablet 0  . Multiple Vitamin (MULTIVITAMIN WITH MINERALS) TABS tablet Take 1 tablet by mouth daily.    Marland Kitchen ibuprofen (ADVIL) 800 MG tablet Take 1 tablet (800 mg total) by mouth 3 (three) times daily. 21 tablet 0  . Flaxseed, Linseed, (FLAX SEED OIL PO) Take by mouth. (Patient not taking: Reported on 10/29/2020)     No facility-administered medications prior to visit.    No Known Allergies  ROS Review of Systems  Constitutional: Negative.   HENT: Negative.  Eyes: Negative.   Respiratory: Negative.   Cardiovascular: Negative.   Gastrointestinal: Negative.   Endocrine: Negative.   Genitourinary: Negative.   Musculoskeletal: Positive for back pain.  Skin: Negative.   Allergic/Immunologic: Negative.   Neurological: Negative.   Hematological: Negative.   Psychiatric/Behavioral: Negative.       Objective:    Physical Exam Vitals and nursing note reviewed.  Constitutional:      Appearance: Normal appearance.  HENT:     Head: Normocephalic and atraumatic.     Right Ear: External ear normal.     Left Ear: External ear normal.     Nose: Nose normal.     Mouth/Throat:     Mouth: Mucous membranes are moist.     Pharynx: Oropharynx is clear.  Eyes:     Extraocular Movements: Extraocular movements intact.     Conjunctiva/sclera:  Conjunctivae normal.     Pupils: Pupils are equal, round, and reactive to light.  Cardiovascular:     Rate and Rhythm: Normal rate and regular rhythm.     Pulses: Normal pulses.     Heart sounds: Normal heart sounds.  Pulmonary:     Effort: Pulmonary effort is normal.     Breath sounds: Normal breath sounds.  Musculoskeletal:     Cervical back: Normal, normal range of motion and neck supple.     Thoracic back: Decreased range of motion.     Lumbar back: Tenderness present. No swelling. Decreased range of motion.  Skin:    General: Skin is warm.  Neurological:     General: No focal deficit present.     Mental Status: She is alert and oriented to person, place, and time.  Psychiatric:        Mood and Affect: Mood normal.        Behavior: Behavior normal.        Thought Content: Thought content normal.        Judgment: Judgment normal.     BP 109/68 (BP Location: Left Arm, Patient Position: Sitting, Cuff Size: Large)   Pulse 70   Temp (!) 97.2 F (36.2 C) (Oral)   Resp 18   Ht 5\' 4"  (1.626 m)   Wt 206 lb (93.4 kg)   SpO2 98%   BMI 35.36 kg/m  Wt Readings from Last 3 Encounters:  12/03/20 206 lb (93.4 kg)  10/29/20 206 lb (93.4 kg)  03/18/19 200 lb (90.7 kg)     There are no preventive care reminders to display for this patient.  There are no preventive care reminders to display for this patient.  Lab Results  Component Value Date   TSH 2.366 10/16/2015   Lab Results  Component Value Date   WBC 6.6 04/29/2018   HGB 11.2 (L) 04/29/2018   HCT 33.8 (L) 04/29/2018   MCV 92.9 04/29/2018   PLT 267 04/29/2018   Lab Results  Component Value Date   NA 143 04/29/2018   K 4.6 04/29/2018   CO2 27 04/29/2018   GLUCOSE 100 (H) 04/29/2018   BUN 18 04/29/2018   CREATININE 1.06 (H) 04/29/2018   BILITOT 0.5 04/30/2018   ALKPHOS 55 04/30/2018   AST 32 04/30/2018   ALT 37 04/30/2018   PROT 6.9 04/30/2018   ALBUMIN 3.8 04/30/2018   CALCIUM 8.9 04/29/2018   ANIONGAP 8  04/29/2018   Lab Results  Component Value Date   CHOL 160 10/16/2015   Lab Results  Component Value Date   HDL 37 (L) 10/16/2015  Lab Results  Component Value Date   LDLCALC 105 10/16/2015   Lab Results  Component Value Date   TRIG 90 10/16/2015   Lab Results  Component Value Date   CHOLHDL 4.3 10/16/2015   No results found for: HGBA1C    Assessment & Plan:   Problem List Items Addressed This Visit   None   Visit Diagnoses    Acute right-sided low back pain with right-sided sciatica    -  Primary   Relevant Medications   ibuprofen (ADVIL) 800 MG tablet   cyclobenzaprine (FLEXERIL) 10 MG tablet   methylPREDNISolone acetate (DEPO-MEDROL) injection 80 mg (Completed)   ketorolac (TORADOL) injection 60 mg (Completed)   Screening for metabolic disorder       Relevant Orders   Comp. Metabolic Panel (12)   Lipid panel   CBC with Differential/Platelet   Vitamin D deficiency       Relevant Orders   Vitamin D, 25-hydroxy    1. Acute right-sided low back pain with right-sided sciatica  Trial ibuprofen 800, Flexeril.  Patient education given on use of ice, gentle stretching, increase hydration.   - ibuprofen (ADVIL) 800 MG tablet; Take 1 tablet (800 mg total) by mouth every 8 (eight) hours as needed.  Dispense: 30 tablet; Refill: 0 - cyclobenzaprine (FLEXERIL) 10 MG tablet; Take 1 tablet (10 mg total) by mouth 3 (three) times daily as needed for muscle spasms.  Dispense: 30 tablet; Refill: 0 - methylPREDNISolone acetate (DEPO-MEDROL) injection 80 mg - ketorolac (TORADOL) injection 60 mg  2. Screening for metabolic disorder Patient recently had a virtual visit with Dr. Earlene Plater to establish care.  Patient completing labs today based on that visit. - Comp. Metabolic Panel (12) - Lipid panel - CBC with Differential/Platelet  3. Vitamin D deficiency Patient recently had a virtual visit with Dr. Earlene Plater to establish care.  Patient completing labs today based on that  visit. - Vitamin D, 25-hydroxy   Meds ordered this encounter  Medications  . ibuprofen (ADVIL) 800 MG tablet    Sig: Take 1 tablet (800 mg total) by mouth every 8 (eight) hours as needed.    Dispense:  30 tablet    Refill:  0    Order Specific Question:   Supervising Provider    Answer:   Delford Field, PATRICK E [[id number]]  . cyclobenzaprine (FLEXERIL) 10 MG tablet    Sig: Take 1 tablet (10 mg total) by mouth 3 (three) times daily as needed for muscle spasms.    Dispense:  30 tablet    Refill:  0    Order Specific Question:   Supervising Provider    Answer:   Delford Field, PATRICK E [[id number]]  . methylPREDNISolone acetate (DEPO-MEDROL) injection 80 mg  . ketorolac (TORADOL) injection 60 mg    I have reviewed the patient's medical history (PMH, PSH, Social History, Family History, Medications, and allergies) , and have been updated if relevant. I spent 30 minutes reviewing chart and  face to face time with patient.    Follow-up: Return if symptoms worsen or fail to improve.    Kasandra Knudsen Mayers, PA-C

## 2020-12-03 NOTE — Progress Notes (Signed)
Patient presents with back pain that presented in the middle of the night Wednesday and has been present since. Pain is currently scaled at a 7 described as throbbing, aching sharp pain depending on movement. Patient shares she took 1/2 tablet of muscle relaxer at 8 pm and a whole tablet at 1 am with minimal relief.

## 2020-12-04 DIAGNOSIS — M549 Dorsalgia, unspecified: Secondary | ICD-10-CM | POA: Insufficient documentation

## 2020-12-04 DIAGNOSIS — M5441 Lumbago with sciatica, right side: Secondary | ICD-10-CM | POA: Insufficient documentation

## 2020-12-04 DIAGNOSIS — E559 Vitamin D deficiency, unspecified: Secondary | ICD-10-CM | POA: Insufficient documentation

## 2020-12-04 LAB — COMP. METABOLIC PANEL (12)
AST: 20 IU/L (ref 0–40)
Albumin/Globulin Ratio: 1.5 (ref 1.2–2.2)
Albumin: 4.5 g/dL (ref 3.8–4.8)
Alkaline Phosphatase: 77 IU/L (ref 44–121)
BUN/Creatinine Ratio: 13 (ref 9–23)
BUN: 13 mg/dL (ref 6–24)
Bilirubin Total: <0.2 mg/dL (ref 0.0–1.2)
Calcium: 9.5 mg/dL (ref 8.7–10.2)
Chloride: 102 mmol/L (ref 96–106)
Creatinine, Ser: 0.99 mg/dL (ref 0.57–1.00)
Globulin, Total: 3.1 g/dL (ref 1.5–4.5)
Glucose: 102 mg/dL — ABNORMAL HIGH (ref 65–99)
Potassium: 4 mmol/L (ref 3.5–5.2)
Sodium: 138 mmol/L (ref 134–144)
Total Protein: 7.6 g/dL (ref 6.0–8.5)
eGFR: 73 mL/min/{1.73_m2} (ref >59)

## 2020-12-04 LAB — CBC WITH DIFFERENTIAL/PLATELET
Basophils Absolute: 0 10*3/uL (ref 0.0–0.2)
Basos: 1 %
EOS (ABSOLUTE): 0 10*3/uL (ref 0.0–0.4)
Eos: 1 %
Hematocrit: 36.3 % (ref 34.0–46.6)
Hemoglobin: 11.7 g/dL (ref 11.1–15.9)
Immature Grans (Abs): 0 10*3/uL (ref 0.0–0.1)
Immature Granulocytes: 0 %
Lymphocytes Absolute: 2.7 10*3/uL (ref 0.7–3.1)
Lymphs: 41 %
MCH: 29.3 pg (ref 26.6–33.0)
MCHC: 32.2 g/dL (ref 31.5–35.7)
MCV: 91 fL (ref 79–97)
Monocytes Absolute: 0.4 10*3/uL (ref 0.1–0.9)
Monocytes: 7 %
Neutrophils Absolute: 3.3 10*3/uL (ref 1.4–7.0)
Neutrophils: 50 %
Platelets: 299 10*3/uL (ref 150–450)
RBC: 4 x10E6/uL (ref 3.77–5.28)
RDW: 12.9 % (ref 11.7–15.4)
WBC: 6.5 10*3/uL (ref 3.4–10.8)

## 2020-12-04 LAB — LIPID PANEL
Chol/HDL Ratio: 4.9 ratio — ABNORMAL HIGH (ref 0.0–4.4)
Cholesterol, Total: 229 mg/dL — ABNORMAL HIGH (ref 100–199)
HDL: 47 mg/dL (ref >39)
LDL Chol Calc (NIH): 158 mg/dL — ABNORMAL HIGH (ref 0–99)
Triglycerides: 135 mg/dL (ref 0–149)
VLDL Cholesterol Cal: 24 mg/dL (ref 5–40)

## 2020-12-04 LAB — VITAMIN D 25 HYDROXY (VIT D DEFICIENCY, FRACTURES): Vit D, 25-Hydroxy: 18.1 ng/mL — ABNORMAL LOW (ref 30.0–100.0)

## 2020-12-08 DIAGNOSIS — E785 Hyperlipidemia, unspecified: Secondary | ICD-10-CM | POA: Insufficient documentation

## 2020-12-08 MED ORDER — VITAMIN D (ERGOCALCIFEROL) 1.25 MG (50000 UNIT) PO CAPS: 50000.0000 [IU] | ORAL_CAPSULE | ORAL | 2 refills | Status: AC

## 2020-12-08 NOTE — Addendum Note (Signed)
Addended by: Roney Jaffe on: 12/08/2020 04:35 PM   Modules accepted: Orders

## 2020-12-14 NOTE — Progress Notes (Signed)
MA UTR patient x2 with no VM option. Patient has viewed results via mychart and may contact the [facility] for any concerns at [phone].

## 2021-04-17 ENCOUNTER — Emergency Department (HOSPITAL_COMMUNITY)
Admission: EM | Admit: 2021-04-17 | Discharge: 2021-04-17 | Disposition: A | Discharge status: Home / Self Care | Payer: 59 | Attending: Emergency Medicine | Admitting: Emergency Medicine

## 2021-04-17 ENCOUNTER — Encounter (HOSPITAL_COMMUNITY): Payer: Self-pay

## 2021-04-17 ENCOUNTER — Emergency Department (HOSPITAL_COMMUNITY): Payer: 59

## 2021-04-17 DIAGNOSIS — Y990 Civilian activity done for income or pay: Secondary | ICD-10-CM | POA: Diagnosis not present

## 2021-04-17 DIAGNOSIS — M79602 Pain in left arm: Secondary | ICD-10-CM

## 2021-04-17 DIAGNOSIS — M545 Low back pain, unspecified: Secondary | ICD-10-CM | POA: Diagnosis not present

## 2021-04-17 DIAGNOSIS — S8012XA Contusion of left lower leg, initial encounter: Secondary | ICD-10-CM | POA: Diagnosis not present

## 2021-04-17 DIAGNOSIS — S90111A Contusion of right great toe without damage to nail, initial encounter: Secondary | ICD-10-CM | POA: Diagnosis not present

## 2021-04-17 DIAGNOSIS — F17200 Nicotine dependence, unspecified, uncomplicated: Secondary | ICD-10-CM | POA: Diagnosis not present

## 2021-04-17 DIAGNOSIS — M7918 Myalgia, other site: Secondary | ICD-10-CM

## 2021-04-17 DIAGNOSIS — S8992XA Unspecified injury of left lower leg, initial encounter: Secondary | ICD-10-CM | POA: Diagnosis present

## 2021-04-17 DIAGNOSIS — W240XXA Contact with lifting devices, not elsewhere classified, initial encounter: Secondary | ICD-10-CM | POA: Insufficient documentation

## 2021-04-17 NOTE — Discharge Instructions (Addendum)
Your workup was overall reassuring. Your xrays did not show any sign of fracture or dislocation.  Take motrin 3 times a day with meals.  Do not take other anti-inflammatories at the same time (Advil, ibuprofen, naproxen, Aleve). You may supplement with Tylenol if you need further pain control. Use ice packs or heating pads if this helps control your pain. Use muscle creams or patches such as Salonpas, icy hot, Bengay, Biofreeze. Follow up with your primary care doctor as needed if symptoms are not improving.  Return to the ER if you develop severe worsening pain, numbness, loss of bowel or bladder control, or any new, worsening, or concerning symptoms.

## 2021-04-17 NOTE — ED Provider Notes (Signed)
[facility]ENCY [facility]PT Provider Note   CSN: [id number] Arrival date & time: 04/17/21  0786     History Chief Complaint  Patient presents with   Kathleen Levy is a 40 y.o. female presenting for evaluation after fall.  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  She did not hit her head or lose consciousness.  This happened 2 days ago.  Since then, she has continued to have severe pain of her left arm and buttock.  She also has a bruise of her left calf and pain in her right great toe.  She has been taking 800 mg of Motrin without significant improvement of symptoms.  She has not tried anything else.  She denies back, chest, abdominal pain.  No new numbness or tingling.  No loss of bowel bladder control.  She has been able to ambulate with discomfort.  She has no medical problems, takes medications daily.  She is not on blood thinners.  She has never seen orthopedics before.  HPI     Past Medical History:  Diagnosis Date   Anemia     Patient Active Problem List   Diagnosis Date Noted   Hyperlipidemia 12/08/2020   Acute right-sided low back pain with right-sided sciatica 12/04/2020   Vitamin D deficiency 12/04/2020    Past Surgical History:  Procedure Laterality Date   DILATION AND CURETTAGE OF UTERUS     INDUCED ABORTION     TOOTH EXTRACTION     TUBAL LIGATION       OB History     Gravida  4   Para  2   Term  2   Preterm      AB  2   Living  2      SAB  1   IAB  1   Ectopic      Multiple      Live Births              Family History  Problem Relation Age of Onset   Diabetes Mother    Hypertension Father    Breast cancer Paternal Aunt     Social History   Tobacco Use   Smoking status: Some Days   Smokeless tobacco: Never   Tobacco comments:    Cloves   Vaping Use   Vaping Use: Never used  Substance Use Topics   Alcohol use:  Yes    Comment: rarely   Drug use: No    Home Medications Prior to Admission medications   Medication Sig Start Date End Date Taking? Authorizing Provider  Black Currant Seed Oil 500 MG CAPS Take by mouth daily.    [provider]  cyclobenzaprine (FLEXERIL) 10 MG tablet Take 1 tablet (10 mg total) by mouth 3 (three) times daily as needed for muscle spasms. 12/03/20   Mayers, Cari S, PA-C  ferrous sulfate (FER-IN-SOL) 75 (15 Fe) MG/ML SOLN Take 15 mg of iron by mouth daily.    [provider]  ibuprofen (ADVIL) 800 MG tablet Take 1 tablet (800 mg total) by mouth every 8 (eight) hours as needed. 12/03/20   Mayers, Cari S, PA-C  ipratropium (ATROVENT) 0.03 % nasal spray Place 2 sprays into both nostrils every 12 (twelve) hours. Patient taking differently: Place 2 sprays into both nostrils every 12 (twelve) hours as needed. 09/30/20   Roxy Horseman, PA-C  metaxalone (  SKELAXIN) 800 MG tablet Take 1 tablet (800 mg total) by mouth 3 (three) times daily. Patient taking differently: Take 800 mg by mouth 3 (three) times daily as needed. 09/30/20   Rodriguez-Southworth, Nettie Elm, PA-C  Multiple Vitamin (MULTIVITAMIN WITH MINERALS) TABS tablet Take 1 tablet by mouth daily.    [provider]  Vitamin D, Ergocalciferol, (DRISDOL) 1.25 MG (50000 UNIT) CAPS capsule Take 1 capsule (50,000 Units total) by mouth every 7 (seven) days. 12/08/20   Mayers, Cari S, PA-C    Allergies    Patient has no known allergies.  Review of Systems   Review of Systems  Musculoskeletal:  Positive for arthralgias.  All other systems reviewed and are negative.  Physical Exam Updated Vital Signs BP 116/84 (BP Location: Right Arm)   Pulse 86   Temp 98.1 F (36.7 C) (Oral)   Resp 18   Ht 5\' 3"  (1.6 m)   Wt 86.6 kg   LMP 03/04/2021 (Exact Date) Comment: Pt had tubal ligation  SpO2 100%   BMI 33.83 kg/m   Physical Exam Vitals and nursing note reviewed.  Constitutional:      General: She is  not in acute distress.    Appearance: Normal appearance.     Comments: Resting in the bed in NAD  HENT:     Head: Normocephalic and atraumatic.  Eyes:     Extraocular Movements: Extraocular movements intact.     Conjunctiva/sclera: Conjunctivae normal.     Pupils: Pupils are equal, round, and reactive to light.  Cardiovascular:     Rate and Rhythm: Normal rate and regular rhythm.     Pulses: Normal pulses.  Pulmonary:     Effort: Pulmonary effort is normal. No respiratory distress.     Breath sounds: Normal breath sounds. No wheezing.     Comments: Speaking in full sentences.  Clear lung sounds in all fields. Abdominal:     General: There is no distension.     Palpations: Abdomen is soft. There is no mass.     Tenderness: There is no abdominal tenderness. There is no guarding or rebound.  Musculoskeletal:     Cervical back: Normal range of motion and neck supple.     Comments: Contusion of the left calf.  Compartments are soft.  No chest palpation over the tibia or fibula.  No tenderness palpation over the knee or joint line, full active range of motion of the knee without difficulty.  Pedal pulses 2+ bilaterally. Mild tenderness palpation of the right great toe, no obvious deformity, may be some mild bruising.  Good distal sensation and cap refill. Tenderness palpation over the left biceps, no obvious deformity.  Full active range of motion of the elbow with pain with full extension both active and passively.  No tenderness palpation over the shoulder.  No chest palpation of the forearm or the left hand.  Radial pulses 2+ bilaterally. No tenderness palpation of back or midline spine. Tenderness palpation over bilateral ischium's and gluteus muscles.  No tenderness palpation over midline sacral spine.  Skin:    General: Skin is warm and dry.     Capillary Refill: Capillary refill takes less than 2 seconds.  Neurological:     Mental Status: She is alert and oriented to person, place,  and time.  Psychiatric:        Mood and Affect: Mood and affect normal.        Speech: Speech normal.        Behavior: Behavior  normal.    ED Results / Procedures / Treatments   Labs (all labs ordered are listed, but only abnormal results are displayed) Labs Reviewed - No data to display   EKG None  Radiology DG Pelvis 1-2 Views  Result Date: 04/17/2021 CLINICAL DATA:  40 year old female with history of trauma from a fall complaining of bilateral buttock pain. EXAM: PELVIS - 1-2 VIEW COMPARISON:  No priors. FINDINGS: There is no evidence of pelvic fracture or diastasis. No pelvic bone lesions are seen. Multiple pelvic phleboliths are incidentally noted. Mild joint space narrowing, subchondral sclerosis and osteophyte formation in the hip joints bilaterally, indicative of osteoarthritis. IMPRESSION: 1. No acute radiographic abnormality of the bony pelvis. 2. Mild bilateral hip joint osteoarthritis. Electronically Signed   By: Trudie Reed M.D.   On: 04/17/2021 08:36   DG Elbow Complete Left  Result Date: 04/17/2021 CLINICAL DATA:  40 year old female with history of trauma from a fall complaining of left elbow pain. EXAM: LEFT ELBOW - COMPLETE 3+ VIEW COMPARISON:  None. FINDINGS: There is no evidence of fracture, dislocation, or joint effusion. There is no evidence of arthropathy or other focal bone abnormality. Soft tissues are unremarkable. IMPRESSION: Negative. Electronically Signed   By: Trudie Reed M.D.   On: 04/17/2021 08:36   DG Toe Great Right  Result Date: 04/17/2021 CLINICAL DATA:  40 year old female with history of trauma from a fall complaining of pain in the right great toe. EXAM: RIGHT GREAT TOE COMPARISON:  No priors. FINDINGS: There is no evidence of fracture or dislocation. There is no evidence of arthropathy or other focal bone abnormality. Soft tissues are unremarkable. IMPRESSION: Negative. Electronically Signed   By: Trudie Reed M.D.   On: 04/17/2021 08:37     Procedures Procedures   Medications Ordered in ED Medications - No data to display  ED Course  I have reviewed the triage vital signs and the nursing notes.  Pertinent labs & imaging results that were available during my care of the patient were reviewed by me and considered in my medical decision making (see chart for details).    MDM Rules/Calculators/A&P                          Patient presenting for evaluation after a fall 2 days ago.  On exam, patient appears nontoxic.  She is neurovascularly intact.  She has pain over her left biceps, bilateral ischium, and right great toe.  Will obtain x-rays, although I have low suspicion for fracture or dislocation.  Likely soft tissue injuries that can be tx symptomatically.   X-rays viewed and independently interpreted by me, no fracture or dislocation.  Discussed findings with patient.  Discussed symptomatic management with NSAIDs, Tylenol, ice, muscle creams/patches.  Encourage follow-up with PCP if symptoms are not improving.  At this time, patient appears safe for discharge.  Return precautions given.  Patient states she understands and agrees to plan.   Final Clinical Impression(s) / ED Diagnoses Final diagnoses:  Left arm pain  Contusion of left lower leg, initial encounter  Buttock pain    Rx / DC Orders ED Discharge Orders     None        Alveria Apley, PA-C 04/17/21 0857    Lorre Nick, MD 04/18/21 1506

## 2021-04-17 NOTE — ED Triage Notes (Signed)
Pt came in with c/o fall. 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 Pt c/o pain in her buttocks, L arm, L knee, and R toe. No LOC. Incident happened two days ago

## 2021-04-17 NOTE — ED Notes (Signed)
Pt states had tubal ligation in 2011.

## 2021-12-29 ENCOUNTER — Encounter: Payer: Self-pay | Admitting: Physician Assistant

## 2021-12-29 ENCOUNTER — Other Ambulatory Visit: Payer: Self-pay

## 2021-12-29 ENCOUNTER — Ambulatory Visit (INDEPENDENT_AMBULATORY_CARE_PROVIDER_SITE_OTHER): Payer: 59 | Admitting: Physician Assistant

## 2021-12-29 VITALS — BP 120/78 | HR 83 | Temp 98.0°F | Resp 18 | Ht 63.0 in | Wt 190.0 lb

## 2021-12-29 DIAGNOSIS — E559 Vitamin D deficiency, unspecified: Secondary | ICD-10-CM | POA: Diagnosis not present

## 2021-12-29 DIAGNOSIS — G4726 Circadian rhythm sleep disorder, shift work type: Secondary | ICD-10-CM | POA: Diagnosis not present

## 2021-12-29 DIAGNOSIS — R5383 Other fatigue: Secondary | ICD-10-CM

## 2021-12-29 DIAGNOSIS — E785 Hyperlipidemia, unspecified: Secondary | ICD-10-CM

## 2021-12-29 DIAGNOSIS — Z6833 Body mass index (BMI) 33.0-33.9, adult: Secondary | ICD-10-CM

## 2021-12-29 DIAGNOSIS — E6609 Other obesity due to excess calories: Secondary | ICD-10-CM

## 2021-12-29 NOTE — Progress Notes (Signed)
? ?Established Patient Office Visit ? ?Subjective:  ?Patient ID: Kathleen Levy, female    DOB: 02/20/1981  Age: 41 y.o. MRN: [id number] ? ?CC:  ?Chief Complaint  ?Patient presents with  ? Referral  ? ? ?HPI ?Kathleen Levy reports that she has been feeling "super exhausted".  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  States that she has a broken sleep pattern, states that she will "catch naps throughout the day".  States that she does drink approximately 64 ounces of water a day, states that she follows an intermittent fasting protocol most days and is working on improving her overall diet. ? ?States that she takes a daily vitamin. ? ?States that she likes to keep her sleeping area warm, does not have a darkened area to sleep during the day, has not tried anything over-the-counter to help her with sleep. ? ?States that her menses is regular. ? ? ? ?Past Medical History:  ?Diagnosis Date  ? Anemia   ? ? ?Past Surgical History:  ?Procedure Laterality Date  ? DILATION AND CURETTAGE OF UTERUS    ? INDUCED ABORTION    ? TOOTH EXTRACTION    ? TUBAL LIGATION    ? ? ?Family History  ?Problem Relation Age of Onset  ? Diabetes Mother   ? Hypertension Father   ? Breast cancer Paternal Aunt   ? ? ?Social History  ? ?Socioeconomic History  ? Marital status: Single  ?  Spouse name: Not on file  ? Number of children: Not on file  ? Years of education: Not on file  ? Highest education level: Not on file  ?Occupational History  ? Not on file  ?Tobacco Use  ? Smoking status: Some Days  ? Smokeless tobacco: Never  ? Tobacco comments:  ?  Cloves   ?Vaping Use  ? Vaping Use: Never used  ?Substance and Sexual Activity  ? Alcohol use: Yes  ?  Comment: rarely  ? Drug use: No  ? Sexual activity: Yes  ?  Birth control/protection: Surgical  ?Other Topics Concern  ? Not on file  ?Social History Narrative  ? Not on file  ? ?Social Determinants of Health  ? ?Financial Resource Strain: Not on file  ?Food  Insecurity: Not on file  ?Transportation Needs: Not on file  ?Physical Activity: Not on file  ?Stress: Not on file  ?Social Connections: Not on file  ?Intimate Partner Violence: Not on file  ? ? ?Outpatient Medications Prior to Visit  ?Medication Sig Dispense Refill  ? Black Currant Seed Oil 500 MG CAPS Take by mouth daily.    ? cyclobenzaprine (FLEXERIL) 10 MG tablet Take 1 tablet (10 mg total) by mouth 3 (three) times daily as needed for muscle spasms. 30 tablet 0  ? ibuprofen (ADVIL) 800 MG tablet Take 1 tablet (800 mg total) by mouth every 8 (eight) hours as needed. 30 tablet 0  ? ipratropium (ATROVENT) 0.03 % nasal spray Place 2 sprays into both nostrils every 12 (twelve) hours. (Patient taking differently: Place 2 sprays into both nostrils every 12 (twelve) hours as needed.) 30 mL 0  ? Multiple Vitamin (MULTIVITAMIN WITH MINERALS) TABS tablet Take 1 tablet by mouth daily.    ? Vitamin D, Ergocalciferol, (DRISDOL) 1.25 MG (50000 UNIT) CAPS capsule Take 1 capsule (50,000 Units total) by mouth every 7 (seven) days. 4 capsule 2  ? ferrous sulfate (FER-IN-SOL) 75 (15 Fe) MG/ML SOLN Take 15 mg of iron  by mouth daily. (Patient not taking: Reported on 12/29/2021)    ? metaxalone (SKELAXIN) 800 MG tablet Take 1 tablet (800 mg total) by mouth 3 (three) times daily. (Patient taking differently: Take 800 mg by mouth 3 (three) times daily as needed.) 21 tablet 0  ? ?No facility-administered medications prior to visit.  ? ? ?No Known Allergies ? ?ROS ?Review of Systems  ?Constitutional:  Positive for fatigue.  ?HENT: Negative.    ?Eyes: Negative.   ?Respiratory:  Negative for shortness of breath.   ?Cardiovascular:  Negative for chest pain.  ?Gastrointestinal: Negative.   ?Endocrine: Negative.   ?Genitourinary: Negative.   ?Musculoskeletal: Negative.   ?Skin: Negative.   ?Allergic/Immunologic: Negative.   ?Neurological:  Negative for dizziness.  ?Hematological: Negative.   ?Psychiatric/Behavioral:  Positive for sleep  disturbance. Negative for dysphoric mood, self-injury and suicidal ideas. The patient is not nervous/anxious.   ? ?  ?Objective:  ?  ?Physical Exam ?Vitals and nursing note reviewed.  ?Constitutional:   ?   Appearance: Normal appearance. She is obese.  ?HENT:  ?   Head: Normocephalic and atraumatic.  ?   Right Ear: External ear normal.  ?   Left Ear: External ear normal.  ?   Nose: Nose normal.  ?   Mouth/Throat:  ?   Mouth: Mucous membranes are moist.  ?   Pharynx: Oropharynx is clear.  ?Eyes:  ?   Extraocular Movements: Extraocular movements intact.  ?   Conjunctiva/sclera: Conjunctivae normal.  ?   Pupils: Pupils are equal, round, and reactive to light.  ?Cardiovascular:  ?   Rate and Rhythm: Normal rate and regular rhythm.  ?   Pulses: Normal pulses.  ?   Heart sounds: Normal heart sounds.  ?Pulmonary:  ?   Effort: Pulmonary effort is normal.  ?   Breath sounds: Normal breath sounds.  ?Musculoskeletal:     ?   General: Normal range of motion.  ?   Cervical back: Normal range of motion and neck supple.  ?Skin: ?   General: Skin is warm and dry.  ?Neurological:  ?   General: No focal deficit present.  ?   Mental Status: She is alert and oriented to person, place, and time.  ?Psychiatric:     ?   Mood and Affect: Mood normal.     ?   Behavior: Behavior normal.     ?   Thought Content: Thought content normal.     ?   Judgment: Judgment normal.  ? ? ?BP 120/78 (BP Location: Right Arm, Patient Position: Sitting, Cuff Size: Normal)   Pulse 83   Temp 98 ?F (36.7 ?C)   Resp 18   Ht 5' 3"  (1.6 m)   Wt 190 lb (86.2 kg)   SpO2 98%   BMI 33.66 kg/m?  ?Wt Readings from Last 3 Encounters:  ?12/29/21 190 lb (86.2 kg)  ?04/17/21 191 lb (86.6 kg)  ?12/03/20 206 lb (93.4 kg)  ? ? ? ?Health Maintenance Due  ?Topic Date Due  ? TETANUS/TDAP  Never done  ? COVID-19 Vaccine (3 - Booster for Pfizer series) 10/02/2020  ? INFLUENZA VACCINE  Never done  ? PAP SMEAR-Modifier  05/31/2021  ? ? ?There are no preventive care reminders to  display for this patient. ? ?Lab Results  ?Component Value Date  ? TSH 2.366 10/16/2015  ? ?Lab Results  ?Component Value Date  ? WBC 6.5 12/03/2020  ? HGB 11.7 12/03/2020  ? HCT 36.3 12/03/2020  ?  MCV 91 12/03/2020  ? PLT 299 12/03/2020  ? ?Lab Results  ?Component Value Date  ? NA 138 12/03/2020  ? K 4.0 12/03/2020  ? CO2 27 04/29/2018  ? GLUCOSE 102 (H) 12/03/2020  ? BUN 13 12/03/2020  ? CREATININE 0.99 12/03/2020  ? BILITOT <0.2 12/03/2020  ? ALKPHOS 77 12/03/2020  ? AST 20 12/03/2020  ? ALT 37 04/30/2018  ? PROT 7.6 12/03/2020  ? ALBUMIN 4.5 12/03/2020  ? CALCIUM 9.5 12/03/2020  ? ANIONGAP 8 04/29/2018  ? EGFR 73 12/03/2020  ? ?Lab Results  ?Component Value Date  ? CHOL 229 (H) 12/03/2020  ? ?Lab Results  ?Component Value Date  ? HDL 47 12/03/2020  ? ?Lab Results  ?Component Value Date  ? LDLCALC 158 (H) 12/03/2020  ? ?Lab Results  ?Component Value Date  ? TRIG 135 12/03/2020  ? ?Lab Results  ?Component Value Date  ? CHOLHDL 4.9 (H) 12/03/2020  ? ?No results found for: HGBA1C ? ?  ?Assessment & Plan:  ? ?Problem List Items Addressed This Visit   ?None ? ? ?No orders of the defined types were placed in this encounter. ? ?1. Fatigue, unspecified type ?Encouraged patient to improve sleep hygiene, improve sleeping cycle, trial melatonin over-the-counter.  Patient to return in 1 day for fasting labs to be completed. ?- CBC with Differential/Platelet; Future ?- Comp. Metabolic Panel (12); Future ? ?2. Circadian rhythm sleep disorder, shift work type ? ? ?3. Vitamin D deficiency ? ?- Vitamin D, 25-hydroxy; Future ? ?4. Hyperlipidemia, unspecified hyperlipidemia type ? ?- Lipid panel; Future ? ? ?I have reviewed the patient's medical history (PMH, PSH, Social History, Family History, Medications, and allergies) , and have been updated if relevant. I spent 30 minutes reviewing chart and  face to face time with patient. ? ? ? ?Follow-up: No follow-ups on file.  ? ? ?Iracema Lanagan S Mayers, PA-C ?

## 2021-12-29 NOTE — Patient Instructions (Signed)
I strongly encourage you to work on improving your sleep schedule, aim to sleep 7 hours at a time.  I encourage you to use melatonin if needed, make sure your environment is nice and dark. ? ?Please return tomorrow to have your fasting labs completed.  Do not forget to schedule your mammogram. ? ?We will call you with your lab results when they are available. ? ?Kennieth Rad, PA-C ?Physician Assistant ?[place] ?[email or web address] ? ? ?Fatigue ?If you have fatigue, you feel tired all the time and have a lack of energy or a lack of motivation. Fatigue may make it difficult to start or complete tasks because of exhaustion. In general, occasional or mild fatigue is often a normal response to activity or life. However, long-lasting (chronic) or extreme fatigue may be a symptom of a medical condition. ?Follow these instructions at home: ?General instructions ?Watch your fatigue for any changes. ?Go to bed and get up at the same time every day. ?Avoid fatigue by pacing yourself during the day and getting enough sleep at night. ?Maintain a healthy weight. ?Medicines ?Take over-the-counter and prescription medicines only as told by your health care provider. ?Take a multivitamin, if told by your health care provider.  ?Do not use herbal or dietary supplements unless they are approved by your health care provider. ?Activity ? ?Exercise regularly, as told by your health care provider. ?Use or practice techniques to help you relax, such as yoga, tai chi, meditation, or massage therapy. ?Eating and drinking ? ?Avoid heavy meals in the evening. ?Eat a well-balanced diet, which includes lean proteins, whole grains, plenty of fruits and vegetables, and low-fat dairy products. ?Avoid consuming too much caffeine. ?Avoid the use of alcohol. ?Drink enough fluid to keep your urine pale yellow. ?Lifestyle ?Change situations that cause you stress. Try to keep your work and  personal schedule in balance. ?Do not use any products that contain nicotine or tobacco, such as cigarettes and e-cigarettes. If you need help quitting, ask your health care provider. ?Do not use drugs. ?Contact a health care provider if: ?Your fatigue does not get better. ?You have a fever. ?You suddenly lose or gain weight. ?You have headaches. ?You have trouble falling asleep or sleeping through the night. ?You feel angry, guilty, anxious, or sad. ?You are unable to have a bowel movement (constipation). ?Your skin is dry. ?You have swelling in your legs or another part of your body. ?Get help right away if: ?You feel confused. ?Your vision is blurry. ?You feel faint or you pass out. ?You have a severe headache. ?You have severe pain in your abdomen, your back, or the area between your waist and hips (pelvis). ?You have chest pain, shortness of breath, or an irregular or fast heartbeat. ?You are unable to urinate, or you urinate less than normal. ?You have abnormal bleeding, such as bleeding from the rectum, vagina, nose, lungs, or nipples. ?You vomit blood. ?You have thoughts about hurting yourself or others. ?If you ever feel like you may hurt yourself or others, or have thoughts about taking your own life, get help right away. You can go to your nearest emergency department or call: ?Your local emergency services (911 in the [place] ?A suicide crisis helpline, such as the [facility] at 319-163-3439 or 988 in the [place]. This is open 24 hours a day. ?Summary ?If you have fatigue, you feel tired all the time and have a lack of energy or a lack of motivation. ?Fatigue  may make it difficult to start or complete tasks because of exhaustion. ?Long-lasting (chronic) or extreme fatigue may be a symptom of a medical condition. ?Exercise regularly, as told by your health care provider. ?Change situations that cause you stress. Try to keep your work and personal schedule in balance. ?This  information is not intended to replace advice given to you by your health care provider. Make sure you discuss any questions you have with your health care provider. ?Document Revised: 04/14/2021 Document Reviewed: 07/30/2020 ?Elsevier Patient Education ? [place]. ? ?
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
## 2021-12-30 ENCOUNTER — Other Ambulatory Visit: Payer: 59

## 2021-12-30 DIAGNOSIS — D649 Anemia, unspecified: Secondary | ICD-10-CM

## 2021-12-30 DIAGNOSIS — E559 Vitamin D deficiency, unspecified: Secondary | ICD-10-CM

## 2021-12-30 DIAGNOSIS — R5383 Other fatigue: Secondary | ICD-10-CM

## 2021-12-30 DIAGNOSIS — E7800 Pure hypercholesterolemia, unspecified: Secondary | ICD-10-CM

## 2021-12-30 DIAGNOSIS — E785 Hyperlipidemia, unspecified: Secondary | ICD-10-CM

## 2021-12-31 ENCOUNTER — Encounter: Payer: Self-pay | Admitting: Physician Assistant

## 2021-12-31 DIAGNOSIS — D649 Anemia, unspecified: Secondary | ICD-10-CM | POA: Insufficient documentation

## 2021-12-31 DIAGNOSIS — E7800 Pure hypercholesterolemia, unspecified: Secondary | ICD-10-CM | POA: Insufficient documentation

## 2021-12-31 LAB — COMP. METABOLIC PANEL (12)
AST: 18 IU/L (ref 0–40)
Albumin/Globulin Ratio: 1.4 (ref 1.2–2.2)
Albumin: 4.3 g/dL (ref 3.8–4.8)
Alkaline Phosphatase: 79 IU/L (ref 44–121)
BUN/Creatinine Ratio: 11 (ref 9–23)
BUN: 11 mg/dL (ref 6–24)
Bilirubin Total: 0.2 mg/dL (ref 0.0–1.2)
Calcium: 9.2 mg/dL (ref 8.7–10.2)
Chloride: 100 mmol/L (ref 96–106)
Creatinine, Ser: 1.02 mg/dL — ABNORMAL HIGH (ref 0.57–1.00)
Globulin, Total: 3 g/dL (ref 1.5–4.5)
Glucose: 84 mg/dL (ref 70–99)
Potassium: 4 mmol/L (ref 3.5–5.2)
Sodium: 135 mmol/L (ref 134–144)
Total Protein: 7.3 g/dL (ref 6.0–8.5)
eGFR: 70 mL/min/{1.73_m2} (ref >59)

## 2021-12-31 LAB — CBC WITH DIFFERENTIAL/PLATELET
Basophils Absolute: 0 10*3/uL (ref 0.0–0.2)
Basos: 0 %
EOS (ABSOLUTE): 0.1 10*3/uL (ref 0.0–0.4)
Eos: 1 %
Hematocrit: 33.8 % — ABNORMAL LOW (ref 34.0–46.6)
Hemoglobin: 10.8 g/dL — ABNORMAL LOW (ref 11.1–15.9)
Immature Grans (Abs): 0 10*3/uL (ref 0.0–0.1)
Immature Granulocytes: 0 %
Lymphocytes Absolute: 3.1 10*3/uL (ref 0.7–3.1)
Lymphs: 41 %
MCH: 29.3 pg (ref 26.6–33.0)
MCHC: 32 g/dL (ref 31.5–35.7)
MCV: 92 fL (ref 79–97)
Monocytes Absolute: 0.4 10*3/uL (ref 0.1–0.9)
Monocytes: 6 %
Neutrophils Absolute: 4 10*3/uL (ref 1.4–7.0)
Neutrophils: 52 %
Platelets: 264 10*3/uL (ref 150–450)
RBC: 3.69 x10E6/uL — ABNORMAL LOW (ref 3.77–5.28)
RDW: 13.1 % (ref 11.7–15.4)
WBC: 7.6 10*3/uL (ref 3.4–10.8)

## 2021-12-31 LAB — LIPID PANEL
Chol/HDL Ratio: 4.6 ratio — ABNORMAL HIGH (ref 0.0–4.4)
Cholesterol, Total: 194 mg/dL (ref 100–199)
HDL: 42 mg/dL (ref >39)
LDL Chol Calc (NIH): 134 mg/dL — ABNORMAL HIGH (ref 0–99)
Triglycerides: 97 mg/dL (ref 0–149)
VLDL Cholesterol Cal: 18 mg/dL (ref 5–40)

## 2021-12-31 LAB — VITAMIN D 25 HYDROXY (VIT D DEFICIENCY, FRACTURES): Vit D, 25-Hydroxy: 34.8 ng/mL (ref 30.0–100.0)

## 2021-12-31 NOTE — Telephone Encounter (Signed)
Patient was called and given lab results. Patient questions were answer. Patient had no further questions ?

## 2022-01-06 ENCOUNTER — Telehealth: Payer: Self-pay | Admitting: *Deleted

## 2022-01-06 NOTE — Telephone Encounter (Signed)
-----   Message from Roney Jaffe, PA-C sent at 12/31/2021  8:15 AM EDT ----- ?Please call patient and let her know that her kidney function and liver function are within normal limits, she does show signs of dehydration.  She does show signs of anemia, she should return for follow-up testing.  Her cholesterol overall is within normal limits, however her LDL is slightly elevated.  Although her risk of a cardiovascular event in the next 10 years is only 2%, I strongly encourage her to follow a low-cholesterol diet and have this rechecked in 6 months to 1 year. ? ?Her vitamin D levels are within normal limits ? ?The 10-year ASCVD risk score (Arnett DK, et al., 2019) is: 1.9% ?  Values used to calculate the score: ?    Age: 41 years ?    Sex: Female ?    Is Non-Hispanic African American: Yes ?    Diabetic: No ?    Tobacco smoker: Yes ?    Systolic Blood Pressure: 120 mmHg ?    Is BP treated: No ?    HDL Cholesterol: 42 mg/dL ?    Total Cholesterol: 194 mg/dL ? ?

## 2022-01-06 NOTE — Telephone Encounter (Signed)
Patient verified DOB Patient is aware of results. 

## 2022-01-26 ENCOUNTER — Emergency Department (HOSPITAL_COMMUNITY)
Admission: EM | Admit: 2022-01-26 | Discharge: 2022-01-26 | Disposition: A | Discharge status: Home / Self Care | Payer: 59 | Attending: Emergency Medicine | Admitting: Emergency Medicine

## 2022-01-26 ENCOUNTER — Other Ambulatory Visit: Payer: Self-pay

## 2022-01-26 ENCOUNTER — Encounter (HOSPITAL_COMMUNITY): Payer: Self-pay

## 2022-01-26 DIAGNOSIS — M79674 Pain in right toe(s): Secondary | ICD-10-CM | POA: Insufficient documentation

## 2022-01-26 DIAGNOSIS — M79676 Pain in unspecified toe(s): Secondary | ICD-10-CM

## 2022-01-26 MED ORDER — ACETAMINOPHEN 500 MG PO TABS
1000.0000 mg | ORAL_TABLET | Freq: Once | ORAL | Status: AC
  Administered 2022-01-26: 1000 mg via ORAL
  Filled 2022-01-26: qty 2

## 2022-01-26 MED ORDER — IBUPROFEN 800 MG PO TABS
800.0000 mg | ORAL_TABLET | Freq: Once | ORAL | Status: AC
  Administered 2022-01-26: 800 mg via ORAL
  Filled 2022-01-26: qty 1

## 2022-01-26 NOTE — ED Provider Notes (Signed)
?[facility]-EMERGENCY DEPT ?Provider Note ? ? ?CSN: [id number] ?Arrival date & time: 01/26/22  0142 ? ?  ? ?History ? ?Chief Complaint  ?Patient presents with  ? Toe Pain  ? ? ?Kathleen Levy is a 41 y.o. female. ? ?41 yo F with a chief complaints of right fifth digit pain.  This is been going on for couple days now.  She denies any trauma to the area.  Tells me that mostly it feels like pins-and-needles.  Just to the pinky toe.  She denies any other issue with the foot. ? ? ?Toe Pain ? ? ?  ? ?Home Medications ?Prior to Admission medications   ?Medication Sig Start Date End Date Taking? Authorizing Provider  ?Black Currant Seed Oil 500 MG CAPS Take by mouth daily.    [provider]  ?cyclobenzaprine (FLEXERIL) 10 MG tablet Take 1 tablet (10 mg total) by mouth 3 (three) times daily as needed for muscle spasms. 12/03/20   Mayers, Cari S, PA-C  ?ferrous sulfate (FER-IN-SOL) 75 (15 Fe) MG/ML SOLN Take 15 mg of iron by mouth daily. ?Patient not taking: Reported on 12/29/2021    [provider]  ?ibuprofen (ADVIL) 800 MG tablet Take 1 tablet (800 mg total) by mouth every 8 (eight) hours as needed. 12/03/20   Mayers, Cari S, PA-C  ?ipratropium (ATROVENT) 0.03 % nasal spray Place 2 sprays into both nostrils every 12 (twelve) hours. ?Patient taking differently: Place 2 sprays into both nostrils every 12 (twelve) hours as needed. 09/30/20   Kathleen Horseman, PA-C  ?Multiple Vitamin (MULTIVITAMIN WITH MINERALS) TABS tablet Take 1 tablet by mouth daily.    [provider]  ?Vitamin D, Ergocalciferol, (DRISDOL) 1.25 MG (50000 UNIT) CAPS capsule Take 1 capsule (50,000 Units total) by mouth every 7 (seven) days. 12/08/20   Mayers, Kathleen Knudsen, PA-C  ?   ? ?Allergies    ?Patient has no known allergies.   ? ?Review of Systems   ?Review of Systems ? ?Physical Exam ?Updated Vital Signs ?BP 114/65 (BP Location: Left Arm)   Pulse 72   Temp 97.6 ?F (36.4 ?C) (Oral)   Resp 16   Ht 5\' 4"  (1.626 m)    Wt 83.9 kg   SpO2 100%   BMI 31.76 kg/m?  ?Physical Exam ?Vitals and nursing note reviewed.  ?Constitutional:   ?   General: She is not in acute distress. ?   Appearance: She is well-developed. She is not diaphoretic.  ?HENT:  ?   Head: Normocephalic and atraumatic.  ?Eyes:  ?   Pupils: Pupils are equal, round, and reactive to light.  ?Cardiovascular:  ?   Rate and Rhythm: Normal rate and regular rhythm.  ?   Heart sounds: No murmur heard. ?  No friction rub. No gallop.  ?Pulmonary:  ?   Effort: Pulmonary effort is normal.  ?   Breath sounds: No wheezing or rales.  ?Abdominal:  ?   General: There is no distension.  ?   Palpations: Abdomen is soft.  ?   Tenderness: There is no abdominal tenderness.  ?Musculoskeletal:     ?   General: No tenderness.  ?   Cervical back: Normal range of motion and neck supple.  ?   Comments: Pulse motor and sensation intact to the right lower extremity.  No obvious deformity or crepitus or signs of bruising to the right fifth digit.  She has full range of motion.  She has some pain to the  plantar surface about the metatarsal head.  ?Skin: ?   General: Skin is warm and dry.  ?Neurological:  ?   Mental Status: She is alert and oriented to person, place, and time.  ?Psychiatric:     ?   Behavior: Behavior normal.  ? ? ?ED Results / Procedures / Treatments   ?Labs ?(all labs ordered are listed, but only abnormal results are displayed) ?Labs Reviewed - No data to display ? ?EKG ?None ? ?Radiology ?No results found. ? ?Procedures ?Procedures  ? ? ?Medications Ordered in ED ?Medications  ?acetaminophen (TYLENOL) tablet 1,000 mg (has no administration in time range)  ?ibuprofen (ADVIL) tablet 800 mg (has no administration in time range)  ? ? ?ED Course/ Medical Decision Making/ A&P ?  ?                        ?Medical Decision Making ?Risk ?OTC drugs. ?Prescription drug management. ? ? ?41 yo F with a chief complaints of right fifth toe pain.  This has been going on for a couple days.  She  denies any trauma.  Pain mostly along the plantar aspect of the foot.  Could be a developing bunion though no obvious deformity on palpation.  No signs of rash.  Neurovascularly intact.  Cap refill less than 2 seconds.  We will have the patient try course of Tylenol and ibuprofen.  PCP follow-up. ? ?2:41 AM:  I have discussed the diagnosis/risks/treatment options with the patient.  Evaluation and diagnostic testing in the emergency department does not suggest an emergent condition requiring admission or immediate intervention beyond what has been performed at this time.  They will follow up with  PCP. We also discussed returning to the ED immediately if new or worsening sx occur. We discussed the sx which are most concerning (e.g., sudden worsening pain, fever, inability to tolerate by mouth) that necessitate immediate return. Medications administered to the patient during their visit and any new prescriptions provided to the patient are listed below. ? ?Medications given during this visit ?Medications  ?acetaminophen (TYLENOL) tablet 1,000 mg (has no administration in time range)  ?ibuprofen (ADVIL) tablet 800 mg (has no administration in time range)  ? ? ? ?The patient appears reasonably screen and/or stabilized for discharge and I doubt any other medical condition or other [facility] requiring further screening, evaluation, or treatment in the ED at this time prior to discharge.  ? ? ? ? ? ? ? ? ?Final Clinical Impression([facility]) / ED Diagnoses ?Final diagnoses:  ?Pain of fifth toe  ? ? ?Rx / DC Orders ?ED Discharge Orders   ? ? None  ? ?  ? ? ?  ?Melene Plan, DO ?01/26/22 0241 ? ?

## 2022-01-26 NOTE — ED Triage Notes (Signed)
Pt reports with right small toe pain and tingling x 2 days.  ?
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
## 2022-08-08 ENCOUNTER — Other Ambulatory Visit: Payer: Self-pay | Admitting: Physician Assistant

## 2022-08-08 DIAGNOSIS — Z1231 Encounter for screening mammogram for malignant neoplasm of breast: Secondary | ICD-10-CM

## 2022-08-12 ENCOUNTER — Ambulatory Visit
Admission: RE | Admit: 2022-08-12 | Discharge: 2022-08-12 | Disposition: A | Discharge status: Home / Self Care | Payer: 59 | Source: Ambulatory Visit

## 2022-08-12 DIAGNOSIS — Z1231 Encounter for screening mammogram for malignant neoplasm of breast: Secondary | ICD-10-CM

## 2022-09-15 ENCOUNTER — Encounter: Payer: Self-pay | Admitting: Family Medicine

## 2022-09-15 ENCOUNTER — Ambulatory Visit: Payer: Self-pay

## 2022-09-15 ENCOUNTER — Ambulatory Visit (INDEPENDENT_AMBULATORY_CARE_PROVIDER_SITE_OTHER): Payer: 59 | Admitting: Family Medicine

## 2022-09-15 VITALS — BP 105/69 | HR 78 | Temp 98.0°F | Resp 16 | Ht 63.5 in | Wt 222.6 lb

## 2022-09-15 DIAGNOSIS — J029 Acute pharyngitis, unspecified: Secondary | ICD-10-CM | POA: Diagnosis not present

## 2022-09-15 DIAGNOSIS — R591 Generalized enlarged lymph nodes: Secondary | ICD-10-CM | POA: Diagnosis not present

## 2022-09-15 DIAGNOSIS — B349 Viral infection, unspecified: Secondary | ICD-10-CM

## 2022-09-15 NOTE — Telephone Encounter (Signed)
  Chief Complaint: left side of neck hurts- 4/10 Symptoms: can feel pain in neck with swallowing- throat is not sore Frequency: yesterday Pertinent Negatives: Patient denies sore throat, fever, SOB, headache Disposition: [] ED /[] Urgent Care (no appt availability in office) / [x] Appointment(In office/virtual)/ []  [facility]/ [] Home Care/ [] Refused Recommended Disposition /[] [facility]/ []  Follow-up with PCP Additional Notes:  Reason for Disposition  [1] MODERATE neck pain (e.g., interferes with normal activities) AND [2] present > 3 days  Answer Assessment - Initial Assessment Questions 1. ONSET: "When did the pain begin?"      yesterday 2. LOCATION: "Where does it hurt?"      Left side 3. PATTERN "Does the pain come and go, or has it been constant since it started?"      Comes and goes 4. SEVERITY: "How bad is the pain?"  (Scale 1-10; or mild, moderate, severe)   - NO PAIN (0): no pain or only slight stiffness    - MILD (1-3): doesn't interfere with normal activities    - MODERATE (4-7): interferes with normal activities or awakens from sleep    - SEVERE (8-10):  excruciating pain, unable to do any normal activities      4/10 moderate 5. RADIATION: "Does the pain go anywhere else, shoot into your arms?"     no 6. CORD SYMPTOMS: "Any weakness or numbness of the arms or legs?"     no 7. CAUSE: "What do you think is causing the neck pain?"     Unsure  8. NECK OVERUSE: "Any recent activities that involved turning or twisting the neck?"     no 9. OTHER SYMPTOMS: "Do you have any other symptoms?" (e.g., headache, fever, chest pain, difficulty breathing, neck swelling)     No- can feel the discomfort with swallowing 10. PREGNANCY: "Is there any chance you are pregnant?" "When was your last menstrual period?"       N/a  Protocols used: Neck Pain or Stiffness-A-AH

## 2022-09-19 ENCOUNTER — Encounter: Payer: Self-pay | Admitting: Family Medicine

## 2022-09-19 NOTE — Progress Notes (Signed)
Established Patient Office Visit  Subjective    Patient ID: Kathleen Levy, female    DOB: [birth year]  Age: 41 y.o. MRN: [id number]  CC:  Chief Complaint  Patient presents with   Sore Throat    neck    HPI Kathleen Levy presents for complaint of sore throat and neck swelling. Also with some congestion and intermittent cough. Denies known contacts or exposures. Denies fever/chills.    Outpatient Encounter Medications as of 09/15/2022  Medication Sig   Black Currant Seed Oil 500 MG CAPS Take by mouth daily.   cyclobenzaprine (FLEXERIL) 10 MG tablet Take 1 tablet (10 mg total) by mouth 3 (three) times daily as needed for muscle spasms.   ferrous sulfate (FER-IN-SOL) 75 (15 Fe) MG/ML SOLN Take 15 mg of iron by mouth daily.   ibuprofen (ADVIL) 800 MG tablet Take 1 tablet (800 mg total) by mouth every 8 (eight) hours as needed.   ipratropium (ATROVENT) 0.03 % nasal spray Place 2 sprays into both nostrils every 12 (twelve) hours. (Patient taking differently: Place 2 sprays into both nostrils every 12 (twelve) hours as needed.)   Multiple Vitamin (MULTIVITAMIN WITH MINERALS) TABS tablet Take 1 tablet by mouth daily.   Vitamin D, Ergocalciferol, (DRISDOL) 1.25 MG (50000 UNIT) CAPS capsule Take 1 capsule (50,000 Units total) by mouth every 7 (seven) days.   No facility-administered encounter medications on file as of 09/15/2022.    Past Medical History:  Diagnosis Date   Anemia     Past Surgical History:  Procedure Laterality Date   DILATION AND CURETTAGE OF UTERUS     INDUCED ABORTION     TOOTH EXTRACTION     TUBAL LIGATION      Family History  Problem Relation Age of Onset   Diabetes Mother    Hypertension Father    Breast cancer Paternal Aunt        @ unknown    Social History   Socioeconomic History   Marital status: Single    Spouse name: Not on file   Number of children: Not on file   Years of education: Not on file   Highest education level: Not on file   Occupational History   Not on file  Tobacco Use   Smoking status: Some Days   Smokeless tobacco: Never   Tobacco comments:    Cloves   Vaping Use   Vaping Use: Never used  Substance and Sexual Activity   Alcohol use: Yes    Comment: rarely   Drug use: No   Sexual activity: Yes    Birth control/protection: Surgical  Other Topics Concern   Not on file  Social History Narrative   Not on file   Social Determinants of Health   Financial Resource Strain: Not on file  Food Insecurity: Not on file  Transportation Needs: Not on file  Physical Activity: Not on file  Stress: Not on file  Social Connections: Not on file  Intimate Partner Violence: Not on file    Review of Systems  Constitutional:  Negative for fever.  HENT:  Positive for congestion and sore throat.   Respiratory:  Positive for cough.   Musculoskeletal:  Positive for neck pain.  All other systems reviewed and are negative.       Objective    BP 105/69   Pulse 78   Temp 98 F (36.7 C) (Oral)   Resp 16   Ht 5' 3.5" (1.613 m)   Wt 222  lb 9.6 oz (101 kg)   BMI 38.81 kg/m   Physical Exam Vitals and nursing note reviewed.  Constitutional:      General: She is not in acute distress. HENT:     Head: Normocephalic and atraumatic.     Right Ear: Tympanic membrane normal.     Left Ear: Tympanic membrane normal.     Mouth/Throat:     Pharynx: Oropharynx is clear. No oropharyngeal exudate or posterior oropharyngeal erythema.  Cardiovascular:     Rate and Rhythm: Normal rate and regular rhythm.  Pulmonary:     Effort: Pulmonary effort is normal.     Breath sounds: Normal breath sounds.  Abdominal:     Palpations: Abdomen is soft.     Tenderness: There is no abdominal tenderness.  Musculoskeletal:     Cervical back: Normal range of motion and neck supple. Tenderness present.  Lymphadenopathy:     Cervical: Cervical adenopathy present.  Neurological:     General: No focal deficit present.     Mental  Status: She is alert and oriented to person, place, and time.         Assessment & Plan:   1. Viral syndrome Recommend adequate rest and fluids with tylenol/nsaids prn  2. Acute pharyngitis, unspecified etiology As above  3. Lymphadenopathy As above    Return if symptoms worsen or fail to improve.   Tommie Raymond, MD

## 2022-10-18 ENCOUNTER — Other Ambulatory Visit: Payer: Self-pay

## 2022-10-18 ENCOUNTER — Encounter: Payer: Self-pay | Admitting: Obstetrics and Gynecology

## 2022-10-18 ENCOUNTER — Other Ambulatory Visit (HOSPITAL_COMMUNITY)
Admission: RE | Admit: 2022-10-18 | Discharge: 2022-10-18 | Disposition: A | Discharge status: Home / Self Care | Payer: 59 | Source: Ambulatory Visit | Attending: Obstetrics and Gynecology | Admitting: Obstetrics and Gynecology

## 2022-10-18 ENCOUNTER — Ambulatory Visit (INDEPENDENT_AMBULATORY_CARE_PROVIDER_SITE_OTHER): Payer: 59 | Admitting: Obstetrics and Gynecology

## 2022-10-18 ENCOUNTER — Encounter: Payer: Self-pay | Admitting: Family Medicine

## 2022-10-18 VITALS — BP 102/74 | HR 83

## 2022-10-18 DIAGNOSIS — Z124 Encounter for screening for malignant neoplasm of cervix: Secondary | ICD-10-CM | POA: Diagnosis present

## 2022-10-18 DIAGNOSIS — R4586 Emotional lability: Secondary | ICD-10-CM | POA: Diagnosis not present

## 2022-10-18 DIAGNOSIS — Z01419 Encounter for gynecological examination (general) (routine) without abnormal findings: Secondary | ICD-10-CM

## 2022-10-18 DIAGNOSIS — Z202 Contact with and (suspected) exposure to infections with a predominantly sexual mode of transmission: Secondary | ICD-10-CM | POA: Insufficient documentation

## 2022-10-18 NOTE — Patient Instructions (Signed)
Psychology today to find a new therapist

## 2022-10-18 NOTE — Progress Notes (Signed)
ANNUAL EXAM Patient name: Kathleen Levy MRN [id number]  Date of birth: [birth year] Chief Complaint:   Gynecologic Exam  History of Present Illness:   Kathleen Levy is a 42 y.o. [id number] being seen today for a routine annual exam.  Current complaints: STI testing, mood concerns  No LMP recorded.  wondering if hormones should be checked due to mood swings. Loss of mother within the last few years. No hot flashes. Has not been in therapy and interested in re-entering. Not currently sexually active. No issues with bowel or urinary concerns. Having regular menstrual cycles.   The pregnancy intention screening data noted above was reviewed. Potential methods of contraception were discussed. The patient elected to proceed with tubal ligation (hx of).   Last pap     Component Value Date/Time   DIAGPAP  05/31/2018 0000    NEGATIVE FOR INTRAEPITHELIAL LESIONS OR MALIGNANCY.   ADEQPAP  05/31/2018 0000    Satisfactory for evaluation  endocervical/transformation zone component PRESENT.    Last mammogram: 08/2022 [facility] 1 Last colonoscopy: N/A     12/29/2021    8:25 AM 12/03/2020    9:19 AM 10/29/2020    1:42 PM 08/14/2020   12:44 PM 05/31/2018    8:53 AM  Depression screen PHQ 2/9  Decreased Interest 1 1 1 1 1   Down, Depressed, Hopeless 0 1 1 1 1   PHQ - 2 Score 1 2 2 2 2   Altered sleeping 0 1 1 1 1   Tired, decreased energy 3 1 1 1 1   Change in appetite 0 0 1 1 1   Feeling bad or failure about yourself  0 0 1 1 1   Trouble concentrating 0 0 1 1 1   Moving slowly or fidgety/restless 0 0 0 1 0  Suicidal thoughts 0 0 0 0 0  PHQ-9 Score 4 4 7 8 7   Difficult doing work/chores   Somewhat difficult          12/29/2021    8:26 AM 12/03/2020    9:19 AM 10/29/2020    1:43 PM 08/14/2020   12:44 PM  GAD 7 : Generalized Anxiety Score  Nervous, Anxious, on Edge 0 1 2 2   Control/stop worrying 0 1 1 1   Worry too much - different things 0 1 1 1   Trouble relaxing 0 1 1 1   Restless 0 0 0 1  Easily  annoyed or irritable 0 1 2 2   Afraid - awful might happen 0 1 1 2   Total GAD 7 Score 0 6 8 10   Anxiety Difficulty   Somewhat difficult      Review of Systems:   Pertinent items are noted in HPI Denies any headaches, blurred vision, fatigue, shortness of breath, chest pain, abdominal pain, abnormal vaginal discharge/itching/odor/irritation, problems with periods, bowel movements, urination, or intercourse unless otherwise stated above. Pertinent History Reviewed:  Reviewed past medical,surgical, social and family history.  Reviewed problem list, medications and allergies. Physical Assessment:   Vitals:   10/18/22 1501  BP: 102/74  Pulse: 83  There is no height or weight on file to calculate BMI.        Physical Examination:   General appearance - well appearing, and in no distress  Mental status - alert, oriented to person, place, and time  Psych:  She has a normal mood and affect  Skin - warm and dry, normal color, no suspicious lesions noted  Chest - effort normal, all lung fields clear to auscultation bilaterally  Heart -  normal rate and regular rhythm  Breasts - breasts appear normal, no suspicious masses, no skin or nipple changes or  axillary nodes  Abdomen - soft, nontender, nondistended, no masses or organomegaly  Pelvic -  VULVA: normal appearing vulva with no masses, tenderness or lesions   VAGINA: normal appearing vagina with normal color and discharge, no lesions   CERVIX: slightly friable external os with slight tenderness to palpation  Thin prep pap is done with HR HPV cotesting  UTERUS: uterus is felt to be normal size, shape, consistency and nontender   ADNEXA: No adnexal masses or tenderness noted.  Extremities:  No swelling or varicosities noted  Chaperone present for exam  No results found for this or any previous visit (from the past 24 hour(s)).    Assessment & Plan:  1. Well woman exam with routine gynecological exam No additional concerns  2.  Screening for malignant neoplasm of cervix Routine pap - Cytology - PAP( [place])  3. Encounter for assessment of STD exposure Routine screening - Cervicovaginal ancillary only( [place]) - HIV Antibody (routine testing w rflx) - [facility] - Hepatitis B Surface AntiGEN - Hepatitis C Antibody  4. Mood swings Recommended psychology today to find new therapist/MH provider - Estradiol - [facility] - TSH Rfx on Abnormal to Free T4   Mammogram: in 1 year, or sooner if problems Colonoscopy: @ 45yo, or sooner if problems  No orders of the defined types were placed in this encounter.   Meds: No orders of the defined types were placed in this encounter.   Follow-up: No follow-ups on file.  Darliss Cheney, MD 10/18/2022 3:13 PM

## 2022-10-19 LAB — CERVICOVAGINAL ANCILLARY ONLY
Bacterial Vaginitis (gardnerella): POSITIVE — AB
Candida Glabrata: NEGATIVE
Candida Vaginitis: NEGATIVE
Chlamydia: NEGATIVE
Comment: NEGATIVE
Comment: NEGATIVE
Comment: NEGATIVE
Comment: NEGATIVE
Comment: NEGATIVE
Comment: NORMAL
Neisseria Gonorrhea: NEGATIVE
Trichomonas: NEGATIVE

## 2022-10-19 LAB — HIV ANTIBODY (ROUTINE TESTING W REFLEX): HIV Screen 4th Generation wRfx: NONREACTIVE

## 2022-10-19 LAB — HEPATITIS C ANTIBODY: Hep C Virus Ab: NONREACTIVE

## 2022-10-19 LAB — HEPATITIS B SURFACE ANTIGEN: Hepatitis B Surface Ag: NEGATIVE

## 2022-10-19 LAB — RPR: RPR Ser Ql: NONREACTIVE

## 2022-10-19 LAB — ESTRADIOL: Estradiol: 29.2 pg/mL

## 2022-10-19 LAB — FOLLICLE STIMULATING HORMONE: FSH: 6.3 m[IU]/mL

## 2022-10-19 LAB — TSH RFX ON ABNORMAL TO FREE T4: TSH: 1.87 u[IU]/mL (ref 0.450–4.500)

## 2022-10-20 ENCOUNTER — Encounter: Payer: Self-pay | Admitting: Obstetrics and Gynecology

## 2022-10-20 MED ORDER — METRONIDAZOLE 500 MG PO TABS: 500.0000 mg | ORAL_TABLET | Freq: Two times a day (BID) | ORAL | 0 refills | Status: DC

## 2022-10-21 LAB — CYTOLOGY - PAP
Comment: NEGATIVE
Diagnosis: NEGATIVE
High risk HPV: NEGATIVE

## 2022-12-01 ENCOUNTER — Encounter: Payer: Self-pay | Admitting: Physician Assistant

## 2022-12-01 ENCOUNTER — Ambulatory Visit: Payer: Self-pay

## 2022-12-01 ENCOUNTER — Ambulatory Visit: Payer: 59 | Admitting: Physician Assistant

## 2022-12-01 VITALS — BP 115/80 | HR 84 | Ht 63.0 in | Wt 227.0 lb

## 2022-12-01 DIAGNOSIS — H65191 Other acute nonsuppurative otitis media, right ear: Secondary | ICD-10-CM

## 2022-12-01 MED ORDER — IBUPROFEN 400 MG PO TABS: 400.0000 mg | ORAL_TABLET | Freq: Three times a day (TID) | ORAL | 0 refills | Status: DC | PRN

## 2022-12-01 MED ORDER — AMOXICILLIN 500 MG PO CAPS: 500.0000 mg | ORAL_CAPSULE | Freq: Two times a day (BID) | ORAL | 0 refills | Status: AC

## 2022-12-01 MED ORDER — IBUPROFEN 200 MG PO TABS
400.0000 mg | ORAL_TABLET | Freq: Once | ORAL | Status: AC
  Administered 2022-12-01: 400 mg via ORAL

## 2022-12-01 NOTE — Telephone Encounter (Signed)
Message from Erick Blinks sent at 12/01/2022  3:02 PM EST  Summary: Left side jawline Swelling, right side ear ache   Pt called reporting that she has an ear ache, and is experiencing swelling in her face. She has a headache that is connected to her ear. She says that her dentist says it looks like she bit down inside her mouth on the opposite side where she had fillings done. Her ear is happening on the other side.  Best contact: [phone]         Chief Complaint: right earache, swelling to left side of jaw near chin Symptoms: moderate sharp pain to ear. Swelling to left jaw tender to touch Frequency: yesterday Pertinent Negatives: Patient denies feadace, stiff neck, dizziness, vomting Disposition: '[]'$ ED /'[]'$ Urgent Care (no appt availability in office) / '[]'$ Appointment(In office/virtual)/ '[]'$  [facility]/ '[]'$ Home Care/ '[]'$ Refused Recommended Disposition /'[x]'$ [facility]/ '[]'$  Follow-up with PCP Additional Notes: no appts in ofice -   Reason for Disposition  Earache  (Exceptions: brief ear pain of < 60 minutes duration, earache occurring during air travel  Answer Assessment - Initial Assessment Questions 1. LOCATION: "Which ear is involved?"     Right ear 2. ONSET: "When did the ear start hurting"      yesterday 3. SEVERITY: "How bad is the pain?"  (Scale 1-10; mild, moderate or severe)   - MILD (1-3): doesn't interfere with normal activities    - MODERATE (4-7): interferes with normal activities or awakens from sleep    - SEVERE (8-10): excruciating pain, unable to do any normal activities      Moderate sharp pain varies 4. URI SYMPTOMS: "Do you have a runny nose or cough?"     no 5. FEVER: "Do you have a fever?" If Yes, ask: "What is your temperature, how was it measured, and when did it start?"     no 6. CAUSE: "Have you been swimming recently?", "How often do you use Q-TIPS?", "Have you had any recent air travel or scuba diving?"     no 7. OTHER SYMPTOMS:  "Do you have any other symptoms?" (e.g., headache, stiff neck, dizziness, vomiting, runny nose, decreased hearing)     Facial swelling to left side- bottom near chin - sore to touch  8. PREGNANCY: "Is there any chance you are pregnant?" "When was your last menstrual period?"     N/a  Protocols used: Bethann Punches

## 2022-12-01 NOTE — Patient Instructions (Signed)
You are going to take amoxicillin twice a day for 10 days.  Use either ibuprofen or Tylenol for pain control.  Make sure you are staying well-hydrated, and get plenty of rest.  I hope that you feel better soon.  Please let us know if there is any else we can do for you.  Kennieth Rad, PA-C Physician Assistant [facility] [email or web address]  Otitis Media, Adult  Otitis media occurs when there is inflammation and fluid in the middle ear with signs and symptoms of an acute infection. The middle ear is a part of the ear that contains bones for hearing as well as air that helps send sounds to the brain. When infected fluid builds up in this space, it causes pressure and can lead to an ear infection. The eustachian tube connects the middle ear to the back of the nose (nasopharynx) and normally allows air into the middle ear. If the eustachian tube becomes blocked, fluid can build up and become infected. What are the causes? This condition is caused by a blockage in the eustachian tube. This can be caused by mucus or by swelling of the tube. Problems that can cause a blockage include: A cold or other upper respiratory infection. Allergies. An irritant, such as tobacco smoke. Enlarged adenoids. The adenoids are areas of soft tissue located high in the back of the throat, behind the nose and the roof of the mouth. They are part of the body's defense system (immune system). A mass in the nasopharynx. Damage to the ear caused by pressure changes (barotrauma). What increases the risk? You are more likely to develop this condition if you: Smoke or are exposed to tobacco smoke. Have an opening in the roof of your mouth (cleft palate). Have gastroesophageal reflux. Have an immune system disorder. What are the signs or symptoms? Symptoms of this condition include: Ear pain. Fever. Decreased hearing. Tiredness (lethargy). Fluid leaking from  the ear, if the eardrum is ruptured or has burst. Ringing in the ear. How is this diagnosed?  This condition is diagnosed with a physical exam. During the exam, your health care provider will use an instrument called an otoscope to look in your ear and check for redness, swelling, and fluid. He or she will also ask about your symptoms. Your health care provider may also order tests, such as: A pneumatic otoscopy. This is a test to check the movement of the eardrum. It is done by squeezing a small amount of air into the ear. A tympanogram. This is a test that shows how well the eardrum moves in response to air pressure in the ear canal. It provides a graph for your health care provider to review. How is this treated? This condition can go away on its own within 3-5 days. But if the condition is caused by a bacterial infection and does not go away on its own, or if it keeps coming back, your health care provider may: Prescribe antibiotic medicine to treat the infection. Prescribe or recommend medicines to control pain. Follow these instructions at home: Take over-the-counter and prescription medicines only as told by your health care provider. If you were prescribed an antibiotic medicine, take it as told by your health care provider. Do not stop taking the antibiotic even if you start to feel better. Keep all follow-up visits. This is important. Contact a health care provider if: You have bleeding from your nose. There is a lump on your neck. You are not feeling better  in 5 days. You feel worse instead of better. Get help right away if: You have severe pain that is not controlled with medicine. You have swelling, redness, or pain around your ear. You have stiffness in your neck. A part of your face is not moving (paralyzed). The bone behind your ear (mastoid bone) is tender when you touch it. You develop a severe headache. Summary Otitis media is redness, soreness, and swelling of the  middle ear, usually resulting in pain and decreased hearing. This condition can go away on its own within 3-5 days. If the problem does not go away in 3-5 days, your health care provider may give you medicines to treat the infection. If you were prescribed an antibiotic medicine, take it as told by your health care provider. Follow all instructions that were given to you by your health care provider. This information is not intended to replace advice given to you by your health care provider. Make sure you discuss any questions you have with your health care provider. Document Revised: 12/28/2020 Document Reviewed: 12/28/2020 Elsevier Patient Education  [place].

## 2022-12-01 NOTE — Progress Notes (Signed)
Established Patient Office Visit  Subjective   Patient ID: Kathleen Levy, female    DOB: 1980-10-31  Age: 42 y.o. MRN: [id number]  Chief Complaint  Patient presents with   Ear Pain    Right Ear pain.     States that she started having pain in her right ear yesterday, describes it as aching.  States that she did have dental work completed earlier yesterday, had a filling put in on the bottom left.  States that she is still having swelling on her left lower jawline.  States that she did go see her dentist today and they thought maybe she bit down on the inside of her mouth while she was still numb.  Denies sick contacts, denies fever, chills.  Has not tried anything for relief.      Past Medical History:  Diagnosis Date   Anemia    Social History   Socioeconomic History   Marital status: Single    Spouse name: Not on file   Number of children: Not on file   Years of education: Not on file   Highest education level: Not on file  Occupational History   Not on file  Tobacco Use   Smoking status: Some Days   Smokeless tobacco: Never   Tobacco comments:    Cloves   Vaping Use   Vaping Use: Never used  Substance and Sexual Activity   Alcohol use: Yes    Comment: rarely   Drug use: No   Sexual activity: Yes    Birth control/protection: Surgical  Other Topics Concern   Not on file  Social History Narrative   Not on file   Social Determinants of Health   Financial Resource Strain: Not on file  Food Insecurity: Not on file  Transportation Needs: Not on file  Physical Activity: Not on file  Stress: Not on file  Social Connections: Not on file  Intimate Partner Violence: Not on file   Family History  Problem Relation Age of Onset   Diabetes Mother    Hypertension Father    Breast cancer Paternal Aunt        @ unknown   No Known Allergies  Review of Systems  Constitutional:  Negative for chills and fever.  HENT:  Positive for ear pain. Negative for  congestion, ear discharge, hearing loss, sore throat and tinnitus.   Eyes: Negative.   Respiratory:  Negative for shortness of breath.   Cardiovascular:  Negative for chest pain.  Gastrointestinal: Negative.   Genitourinary: Negative.   Musculoskeletal:  Negative for myalgias.  Skin: Negative.   Neurological: Negative.   Endo/Heme/Allergies: Negative.   Psychiatric/Behavioral: Negative.        Objective:     BP 115/80 (BP Location: Left Arm, Patient Position: Sitting, Cuff Size: Large)   Pulse 84   Ht '5\' 3"'$  (1.6 m)   Wt 227 lb (103 kg)   LMP 11/04/2022   SpO2 98%   BMI 40.21 kg/m    Physical Exam Vitals and nursing note reviewed.  Constitutional:      Appearance: Normal appearance.  HENT:     Head: Normocephalic.     Jaw: Swelling present.     Comments: Slight swelling on left lower jaw    Right Ear: Tympanic membrane is injected and bulging.     Left Ear: Tympanic membrane, ear canal and external ear normal.     Nose: Nose normal.     Right Sinus: No maxillary sinus tenderness  or frontal sinus tenderness.     Left Sinus: No maxillary sinus tenderness or frontal sinus tenderness.     Mouth/Throat:     Lips: Pink.     Mouth: Mucous membranes are moist.     Pharynx: Oropharynx is clear.  Cardiovascular:     Rate and Rhythm: Normal rate and regular rhythm.     Pulses: Normal pulses.     Heart sounds: Normal heart sounds.  Pulmonary:     Effort: Pulmonary effort is normal.     Breath sounds: Normal breath sounds.  Musculoskeletal:        General: Normal range of motion.     Cervical back: Normal range of motion and neck supple.  Lymphadenopathy:     Head:     Right side of head: Preauricular adenopathy present.  Skin:    General: Skin is warm and dry.  Neurological:     General: No focal deficit present.     Mental Status: She is alert and oriented to person, place, and time.  Psychiatric:        Mood and Affect: Mood normal.        Behavior: Behavior  normal.        Thought Content: Thought content normal.        Judgment: Judgment normal.        Assessment & Plan:   Problem List Items Addressed This Visit   None Visit Diagnoses     Other non-recurrent acute nonsuppurative otitis media of right ear    -  Primary   Relevant Medications   amoxicillin (AMOXIL) 500 MG capsule   ibuprofen (ADVIL) 400 MG tablet   ibuprofen (ADVIL) tablet 400 mg (Completed)      1. Other non-recurrent acute nonsuppurative otitis media of right ear Trial amoxicillin, ibuprofen.  Patient given ibuprofen in clinic.  Patient education given on supportive care.  Red flags given for prompt reevaluation. - amoxicillin (AMOXIL) 500 MG capsule; Take 1 capsule (500 mg total) by mouth 2 (two) times daily for 10 days.  Dispense: 20 capsule; Refill: 0 - ibuprofen (ADVIL) 400 MG tablet; Take 1 tablet (400 mg total) by mouth every 8 (eight) hours as needed.  Dispense: 30 tablet; Refill: 0 - ibuprofen (ADVIL) tablet 400 mg   I have reviewed the patient's medical history (PMH, PSH, Social History, Family History, Medications, and allergies) , and have been updated if relevant. I spent 30 minutes reviewing chart and  face to face time with patient.   Return if symptoms worsen or fail to improve.    Loraine Grip Mayers, PA-C

## 2022-12-12 ENCOUNTER — Encounter: Payer: Self-pay | Admitting: Family Medicine

## 2022-12-13 ENCOUNTER — Encounter: Payer: Self-pay | Admitting: Family Medicine

## 2022-12-13 ENCOUNTER — Ambulatory Visit: Payer: 59 | Admitting: Family Medicine

## 2022-12-13 VITALS — BP 104/72 | HR 90 | Temp 98.1°F | Resp 16 | Wt 229.0 lb

## 2022-12-13 DIAGNOSIS — F32A Depression, unspecified: Secondary | ICD-10-CM | POA: Diagnosis not present

## 2022-12-13 DIAGNOSIS — F419 Anxiety disorder, unspecified: Secondary | ICD-10-CM

## 2022-12-13 DIAGNOSIS — M79662 Pain in left lower leg: Secondary | ICD-10-CM | POA: Diagnosis not present

## 2022-12-13 DIAGNOSIS — Z6841 Body Mass Index (BMI) 40.0 and over, adult: Secondary | ICD-10-CM

## 2022-12-13 NOTE — Progress Notes (Signed)
Established Patient Office Visit  Subjective    Patient ID: Kathleen Levy, female    DOB: Nov 17, 1980  Age: 42 y.o. MRN: [id number]  CC:  Chief Complaint  Patient presents with   Leg Pain    HPI Kathleen Levy presents with complaint of intermittent left calf pain for the past 1-2 months. Has not taken any meds for sx. Denies known trauma or injury. Patient does not smoke currently but smoked in the past    Outpatient Encounter Medications as of 12/13/2022  Medication Sig   Black Currant Seed Oil 500 MG CAPS Take by mouth daily.   cyclobenzaprine (FLEXERIL) 10 MG tablet Take 1 tablet (10 mg total) by mouth 3 (three) times daily as needed for muscle spasms.   ferrous sulfate (FER-IN-SOL) 75 (15 Fe) MG/ML SOLN Take 15 mg of iron by mouth daily.   ibuprofen (ADVIL) 400 MG tablet Take 1 tablet (400 mg total) by mouth every 8 (eight) hours as needed.   ipratropium (ATROVENT) 0.03 % nasal spray Place 2 sprays into both nostrils every 12 (twelve) hours.   Magnesium 250 MG TABS Take 1 tablet by mouth once.   metroNIDAZOLE (FLAGYL) 500 MG tablet Take 1 tablet (500 mg total) by mouth 2 (two) times daily.   Multiple Vitamin (MULTIVITAMIN WITH MINERALS) TABS tablet Take 1 tablet by mouth daily.   Vitamin D, Ergocalciferol, (DRISDOL) 1.25 MG (50000 UNIT) CAPS capsule Take 1 capsule (50,000 Units total) by mouth every 7 (seven) days.   No facility-administered encounter medications on file as of 12/13/2022.    Past Medical History:  Diagnosis Date   Anemia     Past Surgical History:  Procedure Laterality Date   DILATION AND CURETTAGE OF UTERUS     INDUCED ABORTION     TOOTH EXTRACTION     TUBAL LIGATION      Family History  Problem Relation Age of Onset   Diabetes Mother    Hypertension Father    Breast cancer Paternal Aunt        @ unknown    Social History   Socioeconomic History   Marital status: Single    Spouse name: Not on file   Number of children: Not on file    Years of education: Not on file   Highest education level: Not on file  Occupational History   Not on file  Tobacco Use   Smoking status: Some Days   Smokeless tobacco: Never   Tobacco comments:    Cloves   Vaping Use   Vaping Use: Never used  Substance and Sexual Activity   Alcohol use: Yes    Comment: rarely   Drug use: No   Sexual activity: Yes    Birth control/protection: Surgical  Other Topics Concern   Not on file  Social History Narrative   Not on file   Social Determinants of Health   Financial Resource Strain: Low Risk  (12/13/2022)   Overall Financial Resource Strain (CARDIA)    Difficulty of Paying Living Expenses: Not very hard  Food Insecurity: No Food Insecurity (12/13/2022)   Hunger Vital Sign    Worried About Running Out of Food in the Last Year: Never true    Ran Out of Food in the Last Year: Never true  Transportation Needs: No Transportation Needs (12/13/2022)   PRAPARE - Hydrologist (Medical): No    Lack of Transportation (Non-Medical): No  Physical Activity: Insufficiently Active (12/13/2022)  Exercise Vital Sign    Days of Exercise per Week: 3 days    Minutes of Exercise per Session: 30 min  Stress: No Stress Concern Present (12/13/2022)   [place]    Feeling of Stress : Not at all  Social Connections: Socially Isolated (12/13/2022)   Social Connection and Isolation Panel [NHANES]    Frequency of Communication with Friends and Family: Three times a week    Frequency of Social Gatherings with Friends and Family: Three times a week    Attends Religious Services: Never    Active Member of Clubs or Organizations: No    Attends Archivist Meetings: Never    Marital Status: Never married  Intimate Partner Violence: Not At Risk (12/13/2022)   Humiliation, Afraid, Rape, and Kick questionnaire    Fear of Current or Ex-Partner: No    Emotionally Abused:  No    Physically Abused: No    Sexually Abused: No    Review of Systems  All other systems reviewed and are negative.       Objective    BP 104/72   Pulse 90   Temp 98.1 F (36.7 C) (Oral)   Resp 16   Wt 229 lb (103.9 kg)   LMP 11/04/2022   SpO2 98%   BMI 40.57 kg/m   Physical Exam Vitals and nursing note reviewed.  Constitutional:      General: She is not in acute distress. Cardiovascular:     Rate and Rhythm: Normal rate and regular rhythm.  Pulmonary:     Effort: Pulmonary effort is normal.     Breath sounds: Normal breath sounds.  Abdominal:     Palpations: Abdomen is soft.     Tenderness: There is no abdominal tenderness.  Neurological:     General: No focal deficit present.     Mental Status: She is alert and oriented to person, place, and time.  Psychiatric:        Mood and Affect: Mood normal.        Behavior: Behavior normal.         Assessment & Plan:  1. Pain of left calf Referral to vascular for further eval/mgt - Ambulatory referral to Vascular Surgery  2. Anxiety and depression Appears stable. Continue   3. Class 3 severe obesity due to excess calories without serious comorbidity with body mass index (BMI) of 40.0 to 44.9 in adult [facility]) Discussed dietary and activity options.     No follow-ups on file.   Becky Sax, MD

## 2022-12-13 NOTE — Progress Notes (Signed)
Patient came with c/o side calf burning of left le Patient said this has been present for many days  There is no pain just burning.

## 2022-12-15 NOTE — Progress Notes (Signed)
Office Note     CC:  Burning in left calf Requesting Provider:  Dorna Mai, MD  HPI: Kathleen Levy is a 42 y.o. (09/19/1981) female presenting at the request of .Dorna Mai, MD for evaluation of burning in the left calf  On exam today, Kathleen Levy was doing well.  A native of [place], she graduated from [employer].  She has 2 children, daughter in her 81s, and a son who is 4.  She works 2 jobs, 1 being at the post office.  She is on her feet for most the day.   Last year, she has had 40 pounds of weight gain.  Over the last month or so, she has had waxing waning episodes of burning sensation in the left calf, specifically the anterior compartment.  This can occur a few times a day, and is self-limited.  It does not wake her up at night.  It is not associated with ambulation.  No significant lower extremity swelling.  She denies ischemic rest pain, tissue loss.  No foot weakness.   Past Medical History:  Diagnosis Date   Anemia     Past Surgical History:  Procedure Laterality Date   DILATION AND CURETTAGE OF UTERUS     INDUCED ABORTION     TOOTH EXTRACTION     TUBAL LIGATION      Social History   Socioeconomic History   Marital status: Single    Spouse name: Not on file   Number of children: Not on file   Years of education: Not on file   Highest education level: Not on file  Occupational History   Not on file  Tobacco Use   Smoking status: Some Days   Smokeless tobacco: Never   Tobacco comments:    Cloves   Vaping Use   Vaping Use: Never used  Substance and Sexual Activity   Alcohol use: Yes    Comment: rarely   Drug use: No   Sexual activity: Yes    Birth control/protection: Surgical  Other Topics Concern   Not on file  Social History Narrative   Not on file   Social Determinants of Health   Financial Resource Strain: Low Risk  (12/13/2022)   Overall Financial Resource Strain (CARDIA)    Difficulty of Paying Living Expenses: Not very hard   Food Insecurity: No Food Insecurity (12/13/2022)   Hunger Vital Sign    Worried About Running Out of Food in the Last Year: Never true    Ran Out of Food in the Last Year: Never true  Transportation Needs: No Transportation Needs (12/13/2022)   PRAPARE - Hydrologist (Medical): No    Lack of Transportation (Non-Medical): No  Physical Activity: Insufficiently Active (12/13/2022)   Exercise Vital Sign    Days of Exercise per Week: 3 days    Minutes of Exercise per Session: 30 min  Stress: No Stress Concern Present (12/13/2022)   [place]    Feeling of Stress : Not at all  Social Connections: Socially Isolated (12/13/2022)   Social Connection and Isolation Panel [NHANES]    Frequency of Communication with Friends and Family: Three times a week    Frequency of Social Gatherings with Friends and Family: Three times a week    Attends Religious Services: Never    Active Member of Clubs or Organizations: No    Attends Archivist Meetings: Never    Marital  Status: Never married  Intimate Partner Violence: Not At Risk (12/13/2022)   Humiliation, Afraid, Rape, and Kick questionnaire    Fear of Current or Ex-Partner: No    Emotionally Abused: No    Physically Abused: No    Sexually Abused: No   Family History  Problem Relation Age of Onset   Diabetes Mother    Hypertension Father    Breast cancer Paternal Aunt        @ unknown    Current Outpatient Medications  Medication Sig Dispense Refill   Black Currant Seed Oil 500 MG CAPS Take by mouth daily.     cyclobenzaprine (FLEXERIL) 10 MG tablet Take 1 tablet (10 mg total) by mouth 3 (three) times daily as needed for muscle spasms. 30 tablet 0   ferrous sulfate (FER-IN-SOL) 75 (15 Fe) MG/ML SOLN Take 15 mg of iron by mouth daily.     ibuprofen (ADVIL) 400 MG tablet Take 1 tablet (400 mg total) by mouth every 8 (eight) hours as needed. 30  tablet 0   ipratropium (ATROVENT) 0.03 % nasal spray Place 2 sprays into both nostrils every 12 (twelve) hours. 30 mL 0   Magnesium 250 MG TABS Take 1 tablet by mouth once.     metroNIDAZOLE (FLAGYL) 500 MG tablet Take 1 tablet (500 mg total) by mouth 2 (two) times daily. 14 tablet 0   Multiple Vitamin (MULTIVITAMIN WITH MINERALS) TABS tablet Take 1 tablet by mouth daily.     Vitamin D, Ergocalciferol, (DRISDOL) 1.25 MG (50000 UNIT) CAPS capsule Take 1 capsule (50,000 Units total) by mouth every 7 (seven) days. 4 capsule 2   No current facility-administered medications for this visit.    No Known Allergies   REVIEW OF SYSTEMS:  [X]  denotes positive finding, [ ]  denotes negative finding Cardiac  Comments:  Chest pain or chest pressure:    Shortness of breath upon exertion:    Short of breath when lying flat:    Irregular heart rhythm:        Vascular    Pain in calf, thigh, or hip brought on by ambulation:    Pain in feet at night that wakes you up from your sleep:     Blood clot in your veins:    Leg swelling:         Pulmonary    Oxygen at home:    Productive cough:     Wheezing:         Neurologic    Sudden weakness in arms or legs:     Sudden numbness in arms or legs:     Sudden onset of difficulty speaking or slurred speech:    Temporary loss of vision in one eye:     Problems with dizziness:         Gastrointestinal    Blood in stool:     Vomited blood:         Genitourinary    Burning when urinating:     Blood in urine:        Psychiatric    Major depression:         Hematologic    Bleeding problems:    Problems with blood clotting too easily:        Skin    Rashes or ulcers:        Constitutional    Fever or chills:      PHYSICAL EXAMINATION:  There were no vitals filed for this visit.  General:  WDWN in NAD; vital signs documented above Gait: Not observed HENT: WNL, normocephalic Pulmonary: normal non-labored breathing , without  wheezing Cardiac: regular HR Abdomen: soft, NT, no masses Skin: without rashes Vascular Exam/Pulses:  Right Left  Radial 2+ (normal) 2+ (normal)  Ulnar 2+ (normal) 2+ (normal)  Femoral    Popliteal    DP 2+ (normal) 2+ (normal)  PT     Extremities: without ischemic changes, without Gangrene , without cellulitis; without open wounds;  Musculoskeletal: no muscle wasting or atrophy  Neurologic: A&O X 3;  No focal weakness or paresthesias are detected Psychiatric:  The pt has Normal affect.   Non-Invasive Vascular Imaging:   LEFT:  - There is no evidence of deep vein thrombosis in the lower extremity.  - There is no evidence of superficial venous thrombosis.    - No cystic structure found in the popliteal fossa.  - Of note, the left popliteal artery, posterior tibial artery and peroneal  artery are triphasic.        ASSESSMENT/PLAN: Kathleen Levy is a 42 y.o. female presenting with left anterior compartment burning not associated with foot drop or muscle weakness.  This is self-limited, and improves without intervention.  Lower extremity venous DVT exam was reviewed demonstrating no evidence of DVT, no evidence of superficial venous thrombosis.  No cystic structure appreciated in the popliteal fossa.  Physical exam, she had a palpable pulse in the foot, with no concern for arterial insufficiency.  I do not have a good etiology for the nerve dysfunction appreciated in the left anterior compartment.  The deep peroneal nerve rests in this compartment and irritation can cause both sensory and motor deficits including foot drop.  No foot drop appreciated.  This does not appears to be chronic exertional compartment syndrome.  No associated pain or cramping.   With normal arterial and venous exam, Kathleen Levy she can follow-up with me as needed. We discussed that the symptoms could be weight related as she has had 40 pound weight gain over the last year. Should the burning sensation  continue, recommend neuromuscular evaluation by orthopedic surgery.   Broadus John, MD [facility] [phone]

## 2022-12-16 ENCOUNTER — Encounter: Payer: Self-pay | Admitting: Vascular Surgery

## 2022-12-16 ENCOUNTER — Ambulatory Visit (HOSPITAL_COMMUNITY)
Admission: RE | Admit: 2022-12-16 | Discharge: 2022-12-16 | Disposition: A | Discharge status: Home / Self Care | Payer: 59 | Source: Ambulatory Visit | Attending: Vascular Surgery | Admitting: Vascular Surgery

## 2022-12-16 ENCOUNTER — Other Ambulatory Visit: Payer: Self-pay | Admitting: *Deleted

## 2022-12-16 ENCOUNTER — Ambulatory Visit: Payer: 59 | Admitting: Vascular Surgery

## 2022-12-16 VITALS — BP 117/81 | HR 78 | Temp 98.1°F | Resp 20 | Ht 63.0 in | Wt 229.0 lb

## 2022-12-16 DIAGNOSIS — M792 Neuralgia and neuritis, unspecified: Secondary | ICD-10-CM | POA: Diagnosis not present

## 2022-12-16 DIAGNOSIS — M79605 Pain in left leg: Secondary | ICD-10-CM | POA: Diagnosis not present

## 2022-12-19 ENCOUNTER — Encounter: Payer: Self-pay | Admitting: Family Medicine

## 2023-02-07 ENCOUNTER — Ambulatory Visit (INDEPENDENT_AMBULATORY_CARE_PROVIDER_SITE_OTHER): Payer: 59 | Admitting: Family Medicine

## 2023-02-07 ENCOUNTER — Encounter: Payer: Self-pay | Admitting: Family Medicine

## 2023-02-07 VITALS — BP 114/72 | HR 72 | Temp 98.1°F | Resp 16 | Wt 231.2 lb

## 2023-02-07 DIAGNOSIS — M25532 Pain in left wrist: Secondary | ICD-10-CM | POA: Diagnosis not present

## 2023-02-10 ENCOUNTER — Encounter: Payer: Self-pay | Admitting: Family Medicine

## 2023-02-10 NOTE — Progress Notes (Signed)
Established Patient Office Visit  Subjective    Patient ID: Kathleen Levy, female    DOB: 07-Feb-1981  Age: 42 y.o. MRN: [id number]  CC: No chief complaint on file.   HPI Kathleen Levy presents for complaint of wrist pain. She reports that she does heavy lifting at work and that it has been interfering. She denies known trauma or injury.    Outpatient Encounter Medications as of 02/07/2023  Medication Sig   Black Currant Seed Oil 500 MG CAPS Take by mouth daily.   cyclobenzaprine (FLEXERIL) 10 MG tablet Take 1 tablet (10 mg total) by mouth 3 (three) times daily as needed for muscle spasms.   ferrous sulfate (FER-IN-SOL) 75 (15 Fe) MG/ML SOLN Take 15 mg of iron by mouth daily.   ibuprofen (ADVIL) 400 MG tablet Take 1 tablet (400 mg total) by mouth every 8 (eight) hours as needed.   ipratropium (ATROVENT) 0.03 % nasal spray Place 2 sprays into both nostrils every 12 (twelve) hours.   Magnesium 250 MG TABS Take 1 tablet by mouth once.   metroNIDAZOLE (FLAGYL) 500 MG tablet Take 1 tablet (500 mg total) by mouth 2 (two) times daily.   Multiple Vitamin (MULTIVITAMIN WITH MINERALS) TABS tablet Take 1 tablet by mouth daily.   Vitamin D, Ergocalciferol, (DRISDOL) 1.25 MG (50000 UNIT) CAPS capsule Take 1 capsule (50,000 Units total) by mouth every 7 (seven) days.   No facility-administered encounter medications on file as of 02/07/2023.    Past Medical History:  Diagnosis Date   Anemia     Past Surgical History:  Procedure Laterality Date   DILATION AND CURETTAGE OF UTERUS     INDUCED ABORTION     TOOTH EXTRACTION     TUBAL LIGATION      Family History  Problem Relation Age of Onset   Diabetes Mother    Hypertension Father    Breast cancer Paternal Aunt        @ unknown    Social History   Socioeconomic History   Marital status: Single    Spouse name: Not on file   Number of children: Not on file   Years of education: Not on file   Highest education level: Not on file   Occupational History   Not on file  Tobacco Use   Smoking status: Some Days   Smokeless tobacco: Never   Tobacco comments:    Cloves   Vaping Use   Vaping Use: Never used  Substance and Sexual Activity   Alcohol use: Yes    Comment: rarely   Drug use: No   Sexual activity: Yes    Birth control/protection: Surgical  Other Topics Concern   Not on file  Social History Narrative   Not on file   Social Determinants of Health   Financial Resource Strain: Low Risk  (12/13/2022)   Overall Financial Resource Strain (CARDIA)    Difficulty of Paying Living Expenses: Not very hard  Food Insecurity: No Food Insecurity (12/13/2022)   Hunger Vital Sign    Worried About Running Out of Food in the Last Year: Never true    Ran Out of Food in the Last Year: Never true  Transportation Needs: No Transportation Needs (12/13/2022)   PRAPARE - Administrator, Civil Service (Medical): No    Lack of Transportation (Non-Medical): No  Physical Activity: Insufficiently Active (12/13/2022)   Exercise Vital Sign    Days of Exercise per Week: 3 days  Minutes of Exercise per Session: 30 min  Stress: No Stress Concern Present (12/13/2022)   [employer] of Occupational Health - Occupational Stress Questionnaire    Feeling of Stress : Not at all  Social Connections: Socially Isolated (12/13/2022)   Social Connection and Isolation Panel [NHANES]    Frequency of Communication with Friends and Family: Three times a week    Frequency of Social Gatherings with Friends and Family: Three times a week    Attends Religious Services: Never    Active Member of Clubs or Organizations: No    Attends Banker Meetings: Never    Marital Status: Never married  Intimate Partner Violence: Not At Risk (12/13/2022)   Humiliation, Afraid, Rape, and Kick questionnaire    Fear of Current or Ex-Partner: No    Emotionally Abused: No    Physically Abused: No    Sexually Abused: No    Review of  Systems  All other systems reviewed and are negative.       Objective    BP 114/72   Pulse 72   Temp 98.1 F (36.7 C) (Oral)   Resp 16   Wt 231 lb 3.2 oz (104.9 kg)   SpO2 98%   BMI 40.96 kg/m   Physical Exam Vitals and nursing note reviewed.  Constitutional:      General: She is not in acute distress. Cardiovascular:     Rate and Rhythm: Normal rate and regular rhythm.  Pulmonary:     Effort: Pulmonary effort is normal.     Breath sounds: Normal breath sounds.  Musculoskeletal:     Left wrist: Tenderness present. No swelling or deformity. Decreased range of motion.  Neurological:     General: No focal deficit present.     Mental Status: She is alert and oriented to person, place, and time.         Assessment & Plan:   1. Left wrist pain Referral to ortho for further eval/mgt. Tylenol/nsaids prn. Consider wearing wrist brace when doing heavy lifting - Ambulatory referral to Orthopedics    Return if symptoms worsen or fail to improve.   Tommie Raymond, MD

## 2023-02-22 ENCOUNTER — Ambulatory Visit: Payer: 59 | Admitting: Orthopedic Surgery

## 2023-02-22 ENCOUNTER — Encounter: Payer: Self-pay | Admitting: Orthopedic Surgery

## 2023-02-22 ENCOUNTER — Other Ambulatory Visit (INDEPENDENT_AMBULATORY_CARE_PROVIDER_SITE_OTHER): Payer: 59

## 2023-02-22 DIAGNOSIS — M25531 Pain in right wrist: Secondary | ICD-10-CM

## 2023-02-22 DIAGNOSIS — M25532 Pain in left wrist: Secondary | ICD-10-CM

## 2023-02-22 NOTE — Progress Notes (Unsigned)
Office Visit Note   Patient: Kathleen Levy           Date of Birth: 04/28/1981           MRN: [id number] Visit Date: 02/22/2023 Requested by: Georganna Skeans, MD [address] [place],  [place] [address] PCP: Georganna Skeans, MD  Subjective: Chief Complaint  Patient presents with   Left Wrist - Pain   Right Wrist - Pain    HPI: Kathleen Levy is a 42 y.o. female who presents to the office reporting bilateral wrist pain on and off for years.  Right equal to the left.  Patient takes ibuprofen for symptoms.  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  She is right-hand dominant..  Denies any discrete injury to each wrist but she states that over time doing that type of work she has developed significant pain on the ulnar side of the wrist with mechanical symptoms.              ROS: All systems reviewed are negative as they relate to the chief complaint within the history of present illness.  Patient denies fevers or chills.  Assessment & Plan: Visit Diagnoses:  1. Bilateral wrist pain     Plan: Impression is normal radiographs bilateral wrist pain with possible TFCC pathology versus ECU tendinitis.  She does have some mechanical symptoms with pronation supination of the wrist.  I think we could consider an injection and/or MRI imaging should her symptoms worsen.  For now she wants to live with what she has.  Ulnar variance is neutral.  Follow-up as needed.  Follow-Up Instructions: No follow-ups on file.   Orders:  Orders Placed This Encounter  Procedures   XR Wrist Complete Left   XR Wrist Complete Right   No orders of the defined types were placed in this encounter.     Procedures: No procedures performed   Clinical Data: No additional findings.  Objective: Vital Signs: There were no vitals taken for this visit.  Physical Exam:  Constitutional: Patient appears well-developed HEENT:  Head: Normocephalic Eyes:EOM are normal Neck: Normal range of  motion Cardiovascular: Normal rate Pulmonary/chest: Effort normal Neurologic: Patient is alert Skin: Skin is warm Psychiatric: Patient has normal mood and affect  Ortho Exam: Ortho exam demonstrates excellent wrist range of motion with flexion extension pronation supination as well as radial and ulnar deviation.  She definitely has some mechanical symptoms when going from pronation to supination with my fingertip palpating over the ulnar styloid.  Grip strength is intact.  EPL FPL interosseous function intact with palpable radial pulses bilaterally.  No DRUJ instability on either wrist.  The ECU tendon does not feel like it subluxate's with pronation and supination.  Specialty Comments:  No specialty comments available.  Imaging: No results found.   PMFS History: Patient Active Problem List   Diagnosis Date Noted   Elevated LDL cholesterol level 12/31/2021   Anemia 12/31/2021   Hyperlipidemia 12/08/2020   Acute right-sided low back pain with right-sided sciatica 12/04/2020   Vitamin D deficiency 12/04/2020   Past Medical History:  Diagnosis Date   Anemia     Family History  Problem Relation Age of Onset   Diabetes Mother    Hypertension Father    Breast cancer Paternal Aunt        @ unknown    Past Surgical History:  Procedure Laterality Date   DILATION AND CURETTAGE OF UTERUS  INDUCED ABORTION     TOOTH EXTRACTION     TUBAL LIGATION     Social History   Occupational History   Not on file  Tobacco Use   Smoking status: Some Days   Smokeless tobacco: Never   Tobacco comments:    Cloves   Vaping Use   Vaping Use: Never used  Substance and Sexual Activity   Alcohol use: Yes    Comment: rarely   Drug use: No   Sexual activity: Yes    Birth control/protection: Surgical

## 2023-05-01 ENCOUNTER — Ambulatory Visit: Payer: 59 | Admitting: Orthopedic Surgery

## 2023-05-01 ENCOUNTER — Encounter: Payer: Self-pay | Admitting: Orthopedic Surgery

## 2023-05-01 DIAGNOSIS — M25531 Pain in right wrist: Secondary | ICD-10-CM

## 2023-05-01 NOTE — Progress Notes (Signed)
Office Visit Note   Patient: Kathleen Levy           Date of Birth: 03-03-1981           MRN: [id number] Visit Date: 05/01/2023 Requested by: Georganna Skeans, MD [address] [place],  [place] [address] PCP: Georganna Skeans, MD  Subjective: Chief Complaint  Patient presents with   Other     F/u wrist pain     HPI: Kathleen Levy is a 42 y.o. female who presents to the office reporting bilateral wrist pain right worse than left.  Last seen at the end of May.  Continues to have flareups.  Has had 2-3 episodes since last office visit and usually they last about 2 days.  She is right-hand dominant.  At the times of her episodes they do become red and swollen.  She does have a family history of gout but does not really report a discrete history of severe painful joint inflammation.  Her symptoms localized ulnar and dorsal and do radiate up the arm to the lateral elbow at times.  Denies any neck pain or scapular pain.  She does report swelling in the wrist at times along with loss of grip strength and dexterity..                ROS: All systems reviewed are negative as they relate to the chief complaint within the history of present illness.  Patient denies fevers or chills.  Assessment & Plan: Visit Diagnoses:  1. 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  Symptoms ongoing now for over 6 months.  Episodic flares.  Could be gout but no real erosions on plain radiographs.  Could be TFCC tear versus tendinitis.  MRI arthrogram right wrist indicated.  Follow-up after that study.  Follow-Up Instructions: No follow-ups on file.   Orders:  Orders Placed This Encounter  Procedures   MR Wrist Right w/ contrast   Arthrogram   No orders of the defined types were placed in this encounter.     Procedures: No procedures performed   Clinical Data: No additional findings.  Objective: Vital Signs: There  were no vitals taken for this visit.  Physical Exam:  Constitutional: Patient appears well-developed HEENT:  Head: Normocephalic Eyes:EOM are normal Neck: Normal range of motion Cardiovascular: Normal rate Pulmonary/chest: Effort normal Neurologic: Patient is alert Skin: Skin is warm Psychiatric: Patient has normal mood and affect  Ortho Exam: Ortho exam demonstrates full pronation supination in both wrist.  No subluxation or tenderness on the ECU tendon.  No Tinel's in the ulnar nerve cubital tunnel.  Bilateral grip strength is intact.  EPL FPL interosseous strength is intact.  Does have some tenderness over the TFCC on the right compared to the left.  No tenderness over the snuffbox on the right-hand side or over the radial styloid  Specialty Comments:  No specialty comments available.  Imaging: No results found.   PMFS History: Patient Active Problem List   Diagnosis Date Noted   Elevated LDL cholesterol level 12/31/2021   Anemia 12/31/2021   Hyperlipidemia 12/08/2020   Acute right-sided low back pain with right-sided sciatica 12/04/2020   Vitamin D deficiency 12/04/2020   Past Medical History:  Diagnosis Date   Anemia     Family History  Problem Relation Age of Onset   Diabetes Mother    Hypertension Father  Breast cancer Paternal Aunt        @ unknown    Past Surgical History:  Procedure Laterality Date   DILATION AND CURETTAGE OF UTERUS     INDUCED ABORTION     TOOTH EXTRACTION     TUBAL LIGATION     Social History   Occupational History   Not on file  Tobacco Use   Smoking status: Some Days   Smokeless tobacco: Never   Tobacco comments:    Cloves   Vaping Use   Vaping status: Never Used  Substance and Sexual Activity   Alcohol use: Yes    Comment: rarely   Drug use: No   Sexual activity: Yes    Birth control/protection: Surgical

## 2023-05-12 ENCOUNTER — Other Ambulatory Visit (HOSPITAL_COMMUNITY)
Admission: RE | Admit: 2023-05-12 | Discharge: 2023-05-12 | Disposition: A | Discharge status: Home / Self Care | Payer: 59 | Source: Ambulatory Visit | Attending: Family Medicine | Admitting: Family Medicine

## 2023-05-12 ENCOUNTER — Other Ambulatory Visit: Payer: Self-pay

## 2023-05-12 ENCOUNTER — Ambulatory Visit (INDEPENDENT_AMBULATORY_CARE_PROVIDER_SITE_OTHER): Payer: 59

## 2023-05-12 VITALS — BP 106/49 | HR 76 | Ht 63.0 in | Wt 232.2 lb

## 2023-05-12 DIAGNOSIS — N898 Other specified noninflammatory disorders of vagina: Secondary | ICD-10-CM

## 2023-05-12 NOTE — Progress Notes (Signed)
Pt here today for self swab.  Pt reports that she had BV earlier this year and was unable to tolerate the medication to complete course.  Pt feels that she continues to have it.  Pt explained how to obtain self swab and that we will call with abnormal results.  Pt verbalized understanding with no further questions.   Leonette Nutting  05/12/23

## 2023-05-16 ENCOUNTER — Encounter: Payer: Self-pay | Admitting: Orthopedic Surgery

## 2023-05-17 ENCOUNTER — Other Ambulatory Visit: Payer: Self-pay | Admitting: Obstetrics and Gynecology

## 2023-05-17 ENCOUNTER — Other Ambulatory Visit: Payer: Self-pay

## 2023-05-17 DIAGNOSIS — B9689 Other specified bacterial agents as the cause of diseases classified elsewhere: Secondary | ICD-10-CM

## 2023-05-17 MED ORDER — METRONIDAZOLE 0.75 % VA GEL: 1.0000 | Freq: Every day | VAGINAL | 1 refills | Status: DC

## 2023-05-23 ENCOUNTER — Encounter: Payer: Self-pay | Admitting: Orthopedic Surgery

## 2023-05-24 ENCOUNTER — Encounter: Payer: Self-pay | Admitting: Orthopedic Surgery

## 2023-05-26 ENCOUNTER — Ambulatory Visit
Admission: RE | Admit: 2023-05-26 | Discharge: 2023-05-26 | Disposition: A | Discharge status: Home / Self Care | Payer: 59 | Source: Ambulatory Visit | Attending: Orthopedic Surgery | Admitting: Orthopedic Surgery

## 2023-05-26 DIAGNOSIS — M25531 Pain in right wrist: Secondary | ICD-10-CM

## 2023-05-26 MED ORDER — IOPAMIDOL (ISOVUE-M 200) INJECTION 41%
1.5000 mL | Freq: Once | INTRAMUSCULAR | Status: AC
  Administered 2023-05-26: 1.5 mL via INTRA_ARTICULAR

## 2023-05-29 ENCOUNTER — Ambulatory Visit: Payer: 59 | Admitting: Orthopedic Surgery

## 2023-05-29 DIAGNOSIS — M25531 Pain in right wrist: Secondary | ICD-10-CM | POA: Diagnosis not present

## 2023-05-30 ENCOUNTER — Encounter: Payer: Self-pay | Admitting: Orthopedic Surgery

## 2023-05-30 NOTE — Progress Notes (Signed)
Office Visit Note   Patient: Kathleen Levy           Date of Birth: 10-03-1981           MRN: [id number] Visit Date: 05/29/2023 Requested by: Georganna Skeans, MD [address] [place],  [place] [address] PCP: Georganna Skeans, MD  Subjective: Chief Complaint  Patient presents with   Other     Review MRI    HPI: [name] is a 42 y.o. female who presents to the office reporting right wrist pain.  Since she was last seen she has had an MRI scan of the wrist which is reviewed with the patient today.  Scan is essentially normal and does not show definitive operative pathology particularly around the TFCC.  She works in a [employer]. Denies any numbness or tingling or neck pain..                ROS: All systems reviewed are negative as they relate to the chief complaint within the history of present illness.  Patient denies fevers or chills.  Assessment & Plan: Visit Diagnoses: No diagnosis found.  Plan: Impression is right wrist pain with normal MRI scan which does not show any actionable pathology.  I think we should wait this out a little longer and if symptoms persist we could consider ultrasound-guided wrist joint injection.  Follow-up as needed  Follow-Up Instructions: No follow-ups on file.   Orders:  No orders of the defined types were placed in this encounter.  No orders of the defined types were placed in this encounter.     Procedures: No procedures performed   Clinical Data: No additional findings.  Objective: Vital Signs: There were no vitals taken for this visit.  Physical Exam:  Constitutional: Patient appears well-developed HEENT:  Head: Normocephalic Eyes:EOM are normal Neck: Normal range of motion Cardiovascular: Normal rate Pulmonary/chest: Effort normal Neurologic: Patient is alert Skin: Skin is warm Psychiatric: Patient has normal mood and affect  Ortho Exam: Ortho exam demonstrates mild tenderness over the TFCC on the right.   No subluxation of the ECU tendon with pronation supination.  Grip strength is intact.  EPL FPL interosseous function intact.  No radial styloid tenderness or snuffbox tenderness.  Full composite range of motion of fingers and thumb with flexion and extension.  Wrist range of motion also symmetric compared to the left-hand side  Specialty Comments:  No specialty comments available.  Imaging: No results found.   PMFS History: Patient Active Problem List   Diagnosis Date Noted   Elevated LDL cholesterol level 12/31/2021   Anemia 12/31/2021   Hyperlipidemia 12/08/2020   Acute right-sided low back pain with right-sided sciatica 12/04/2020   Vitamin D deficiency 12/04/2020   Past Medical History:  Diagnosis Date   Anemia     Family History  Problem Relation Age of Onset   Diabetes Mother    Hypertension Father    Breast cancer Paternal Aunt        @ unknown    Past Surgical History:  Procedure Laterality Date   DILATION AND CURETTAGE OF UTERUS     INDUCED ABORTION     TOOTH EXTRACTION     TUBAL LIGATION     Social History   Occupational History   Not on file  Tobacco Use   Smoking status: Some Days   Smokeless tobacco: Never   Tobacco comments:    Cloves   Vaping Use   Vaping status: Never Used  Substance and Sexual Activity   Alcohol use: Yes    Comment: rarely   Drug use: No   Sexual activity: Yes    Birth control/protection: Surgical

## 2023-07-03 ENCOUNTER — Encounter: Payer: Self-pay | Admitting: Podiatry

## 2023-07-03 ENCOUNTER — Ambulatory Visit: Payer: 59 | Admitting: Podiatry

## 2023-07-03 DIAGNOSIS — L853 Xerosis cutis: Secondary | ICD-10-CM | POA: Diagnosis not present

## 2023-07-03 NOTE — Progress Notes (Signed)
  Subjective:  Patient ID: Kathleen Levy, female    DOB: [birth year],   MRN: [id number]  Chief Complaint  Patient presents with   Foot Pain    Pt presents for cracked heels that hurts.    42 y.o. female presents for concern of dry cracked heels that are painful. Relates they have been this way for about ayear but started to get painful recently. Has tried aquaphor and vaseline and other treatments daily without much relief. Also gets her feet done regularly. Denies itching.  . Denies any other pedal complaints. Denies n/v/f/c.   Past Medical History:  Diagnosis Date   Anemia     Objective:  Physical Exam: Vascular: DP/PT pulses 2/4 bilateral. CFT <3 seconds. Normal hair growth on digits. No edema.  Skin. No lacerations or abrasions bilateral feet. Hyperkeratosis and fissues noted to plantar posterior heels bilatearl.  Musculoskeletal: MMT 5/5 bilateral lower extremities in DF, PF, Inversion and Eversion. Deceased ROM in DF of ankle joint.  Neurological: Sensation intact to light touch.   Assessment:   1. Xerosis of skin      Plan:  Patient was evaluated and treated and all questions answered. -Examined patient -Discussed treatment options for painful cracked heels.  -Discussed continuing aquaphor and or O'keefes healthy feet in the evening.  -Ammonium lactate prescribed to be applied in mornings.  Advised on wearing socks not going barefoot and hydrating.  Return as needed.    Louann Sjogren, DPM

## 2023-07-04 ENCOUNTER — Encounter: Payer: Self-pay | Admitting: Podiatry

## 2023-07-04 MED ORDER — AMMONIUM LACTATE 12 % EX CREA: 1.0000 | TOPICAL_CREAM | CUTANEOUS | 0 refills | Status: DC | PRN

## 2023-12-04 ENCOUNTER — Other Ambulatory Visit: Payer: Self-pay | Admitting: Family Medicine

## 2023-12-04 DIAGNOSIS — Z0000 Encounter for general adult medical examination without abnormal findings: Secondary | ICD-10-CM

## 2023-12-06 ENCOUNTER — Ambulatory Visit
Admission: RE | Admit: 2023-12-06 | Discharge: 2023-12-06 | Disposition: A | Discharge status: Home / Self Care | Source: Ambulatory Visit | Attending: Family Medicine | Admitting: Family Medicine

## 2023-12-06 DIAGNOSIS — Z0000 Encounter for general adult medical examination without abnormal findings: Secondary | ICD-10-CM

## 2023-12-12 ENCOUNTER — Other Ambulatory Visit: Payer: Self-pay | Admitting: Family Medicine

## 2023-12-12 ENCOUNTER — Encounter: Payer: Self-pay | Admitting: Family Medicine

## 2023-12-12 ENCOUNTER — Ambulatory Visit (INDEPENDENT_AMBULATORY_CARE_PROVIDER_SITE_OTHER): Admitting: Family Medicine

## 2023-12-12 VITALS — BP 120/79 | HR 77 | Temp 98.4°F | Resp 16 | Ht <= 58 in | Wt 237.6 lb

## 2023-12-12 DIAGNOSIS — Z1329 Encounter for screening for other suspected endocrine disorder: Secondary | ICD-10-CM

## 2023-12-12 DIAGNOSIS — Z13220 Encounter for screening for lipoid disorders: Secondary | ICD-10-CM | POA: Diagnosis not present

## 2023-12-12 DIAGNOSIS — Z0000 Encounter for general adult medical examination without abnormal findings: Secondary | ICD-10-CM

## 2023-12-12 DIAGNOSIS — Z13228 Encounter for screening for other metabolic disorders: Secondary | ICD-10-CM

## 2023-12-12 DIAGNOSIS — Z130 Encounter for screening for diseases of the blood and blood-forming organs and certain disorders involving the immune mechanism: Secondary | ICD-10-CM

## 2023-12-12 DIAGNOSIS — R928 Other abnormal and inconclusive findings on diagnostic imaging of breast: Secondary | ICD-10-CM

## 2023-12-12 NOTE — Progress Notes (Unsigned)
 Blood work and I want to start a Zepbound to assist with my weightloss. My insurance needs prior authorization for this prescription.   -Patient is here to have annually  complete physical examination  -Care gap address -labs taken

## 2023-12-13 ENCOUNTER — Encounter: Payer: Self-pay | Admitting: Family Medicine

## 2023-12-13 LAB — LIPID PANEL
Chol/HDL Ratio: 5.4 ratio — ABNORMAL HIGH (ref 0.0–4.4)
Cholesterol, Total: 247 mg/dL — ABNORMAL HIGH (ref 100–199)
HDL: 46 mg/dL (ref >39)
LDL Chol Calc (NIH): 177 mg/dL — ABNORMAL HIGH (ref 0–99)
Triglycerides: 134 mg/dL (ref 0–149)
VLDL Cholesterol Cal: 24 mg/dL (ref 5–40)

## 2023-12-13 LAB — CBC WITH DIFFERENTIAL/PLATELET
Basophils Absolute: 0 10*3/uL (ref 0.0–0.2)
Basos: 0 %
EOS (ABSOLUTE): 0 10*3/uL (ref 0.0–0.4)
Eos: 1 %
Hematocrit: 35.7 % (ref 34.0–46.6)
Hemoglobin: 11.7 g/dL (ref 11.1–15.9)
Immature Grans (Abs): 0 10*3/uL (ref 0.0–0.1)
Immature Granulocytes: 0 %
Lymphocytes Absolute: 1.8 10*3/uL (ref 0.7–3.1)
Lymphs: 24 %
MCH: 29.5 pg (ref 26.6–33.0)
MCHC: 32.8 g/dL (ref 31.5–35.7)
MCV: 90 fL (ref 79–97)
Monocytes Absolute: 0.6 10*3/uL (ref 0.1–0.9)
Monocytes: 8 %
Neutrophils Absolute: 5 10*3/uL (ref 1.4–7.0)
Neutrophils: 67 %
Platelets: 324 10*3/uL (ref 150–450)
RBC: 3.97 x10E6/uL (ref 3.77–5.28)
RDW: 13 % (ref 11.7–15.4)
WBC: 7.5 10*3/uL (ref 3.4–10.8)

## 2023-12-13 LAB — HEMOGLOBIN A1C
Est. average glucose Bld gHb Est-mCnc: 131 mg/dL
Hgb A1c MFr Bld: 6.2 % — ABNORMAL HIGH (ref 4.8–5.6)

## 2023-12-13 LAB — CMP14+EGFR
ALT: 12 IU/L (ref 0–32)
AST: 17 IU/L (ref 0–40)
Albumin: 4.6 g/dL (ref 3.9–4.9)
Alkaline Phosphatase: 95 IU/L (ref 44–121)
BUN/Creatinine Ratio: 24 — ABNORMAL HIGH (ref 9–23)
BUN: 21 mg/dL (ref 6–24)
Bilirubin Total: 0.3 mg/dL (ref 0.0–1.2)
CO2: 20 mmol/L (ref 20–29)
Calcium: 9.4 mg/dL (ref 8.7–10.2)
Chloride: 98 mmol/L (ref 96–106)
Creatinine, Ser: 0.86 mg/dL (ref 0.57–1.00)
Globulin, Total: 3.3 g/dL (ref 1.5–4.5)
Glucose: 85 mg/dL (ref 70–99)
Potassium: 4.5 mmol/L (ref 3.5–5.2)
Sodium: 136 mmol/L (ref 134–144)
Total Protein: 7.9 g/dL (ref 6.0–8.5)
eGFR: 85 mL/min/{1.73_m2} (ref >59)

## 2023-12-13 LAB — TSH: TSH: 3.06 u[IU]/mL (ref 0.450–4.500)

## 2023-12-13 NOTE — Progress Notes (Signed)
 Established Patient Office Visit  Subjective    Patient ID: Kathleen Levy, female    DOB: 1981-03-31  Age: 43 y.o. MRN: [id number]  CC:  Chief Complaint  Patient presents with   Annual Exam    HPI Kathleen Levy presents for routine annual exam. Patient denies acute complaints or concerns.   Outpatient Encounter Medications as of 12/12/2023  Medication Sig   ammonium lactate (AMLACTIN) 12 % cream Apply 1 Application topically as needed for dry skin. (Patient not taking: Reported on 12/12/2023)   Black Currant Seed Oil 500 MG CAPS Take by mouth daily. (Patient not taking: Reported on 12/12/2023)   cyclobenzaprine (FLEXERIL) 10 MG tablet Take 1 tablet (10 mg total) by mouth 3 (three) times daily as needed for muscle spasms. (Patient not taking: Reported on 12/12/2023)   ferrous sulfate (FER-IN-SOL) 75 (15 Fe) MG/ML SOLN Take 15 mg of iron by mouth daily. (Patient not taking: Reported on 05/12/2023)   ibuprofen (ADVIL) 400 MG tablet Take 1 tablet (400 mg total) by mouth every 8 (eight) hours as needed. (Patient not taking: Reported on 12/12/2023)   ipratropium (ATROVENT) 0.03 % nasal spray Place 2 sprays into both nostrils every 12 (twelve) hours. (Patient not taking: Reported on 12/12/2023)   Magnesium 250 MG TABS Take 1 tablet by mouth once. (Patient not taking: Reported on 12/12/2023)   metroNIDAZOLE (FLAGYL) 500 MG tablet Take 1 tablet (500 mg total) by mouth 2 (two) times daily. (Patient not taking: Reported on 12/12/2023)   metroNIDAZOLE (METROGEL) 0.75 % vaginal gel Place 1 Applicatorful vaginally at bedtime. Apply one applicatorful to vagina at bedtime for 5 days (Patient not taking: Reported on 12/12/2023)   Multiple Vitamin (MULTIVITAMIN WITH MINERALS) TABS tablet Take 1 tablet by mouth daily. (Patient not taking: Reported on 12/12/2023)   Vitamin D, Ergocalciferol, (DRISDOL) 1.25 MG (50000 UNIT) CAPS capsule Take 1 capsule (50,000 Units total) by mouth every 7 (seven) days. (Patient not  taking: Reported on 12/12/2023)   No facility-administered encounter medications on file as of 12/12/2023.    Past Medical History:  Diagnosis Date   Anemia     Past Surgical History:  Procedure Laterality Date   DILATION AND CURETTAGE OF UTERUS     INDUCED ABORTION     TOOTH EXTRACTION     TUBAL LIGATION      Family History  Problem Relation Age of Onset   Diabetes Mother    Hypertension Father    Breast cancer Paternal Aunt        @ unknown    Social History   Socioeconomic History   Marital status: Single    Spouse name: Not on file   Number of children: Not on file   Years of education: Not on file   Highest education level: Not on file  Occupational History   Not on file  Tobacco Use   Smoking status: Some Days   Smokeless tobacco: Never   Tobacco comments:    Cloves   Vaping Use   Vaping status: Never Used  Substance and Sexual Activity   Alcohol use: Yes    Comment: rarely   Drug use: No   Sexual activity: Yes    Birth control/protection: Surgical  Other Topics Concern   Not on file  Social History Narrative   Not on file   Social Drivers of Health   Financial Resource Strain: Low Risk  (12/13/2022)   Overall Financial Resource Strain (CARDIA)    Difficulty of  Paying Living Expenses: Not very hard  Food Insecurity: No Food Insecurity (12/13/2022)   Hunger Vital Sign    Worried About Running Out of Food in the Last Year: Never true    Ran Out of Food in the Last Year: Never true  Transportation Needs: No Transportation Needs (12/13/2022)   PRAPARE - Administrator, Civil Service (Medical): No    Lack of Transportation (Non-Medical): No  Physical Activity: Insufficiently Active (12/13/2022)   Exercise Vital Sign    Days of Exercise per Week: 3 days    Minutes of Exercise per Session: 30 min  Stress: No Stress Concern Present (12/13/2022)   [employer] of Occupational Health - Occupational Stress Questionnaire    Feeling of Stress  : Not at all  Social Connections: Socially Isolated (12/13/2022)   Social Connection and Isolation Panel [NHANES]    Frequency of Communication with Friends and Family: Three times a week    Frequency of Social Gatherings with Friends and Family: Three times a week    Attends Religious Services: Never    Active Member of Clubs or Organizations: No    Attends Banker Meetings: Never    Marital Status: Never married  Intimate Partner Violence: Not At Risk (12/13/2022)   Humiliation, Afraid, Rape, and Kick questionnaire    Fear of Current or Ex-Partner: No    Emotionally Abused: No    Physically Abused: No    Sexually Abused: No    Review of Systems  All other systems reviewed and are negative.       Objective    BP 120/79   Pulse 77   Temp 98.4 F (36.9 C) (Oral)   Resp 16   Wt 237 lb 9.6 oz (107.8 kg)   SpO2 96%   BMI 42.09 kg/m   Physical Exam Vitals and nursing note reviewed.  Constitutional:      General: She is not in acute distress. HENT:     Head: Normocephalic and atraumatic.     Right Ear: Tympanic membrane, ear canal and external ear normal.     Left Ear: Tympanic membrane, ear canal and external ear normal.     Nose: Nose normal.     Mouth/Throat:     Mouth: Mucous membranes are moist.     Pharynx: Oropharynx is clear.  Eyes:     Conjunctiva/sclera: Conjunctivae normal.     Pupils: Pupils are equal, round, and reactive to light.  Neck:     Thyroid: No thyromegaly.  Cardiovascular:     Rate and Rhythm: Normal rate and regular rhythm.     Heart sounds: Normal heart sounds. No murmur heard. Pulmonary:     Effort: Pulmonary effort is normal. No respiratory distress.     Breath sounds: Normal breath sounds.  Abdominal:     General: There is no distension.     Palpations: Abdomen is soft. There is no mass.     Tenderness: There is no abdominal tenderness.  Musculoskeletal:        General: Normal range of motion.     Cervical back: Normal  range of motion and neck supple.  Skin:    General: Skin is warm and dry.  Neurological:     General: No focal deficit present.     Mental Status: She is alert and oriented to person, place, and time.  Psychiatric:        Mood and Affect: Mood normal.  Behavior: Behavior normal.         Assessment & Plan:   Annual physical exam -     CMP14+EGFR  Screening for deficiency anemia -     CBC with Differential/Platelet  Screening for lipid disorders -     Lipid panel  Screening for endocrine/metabolic/immunity disorders -     Hemoglobin A1c -     TSH     Return in about 1 year (around 12/11/2024) for physical.   Tommie Raymond, MD

## 2023-12-20 ENCOUNTER — Ambulatory Visit
Admission: RE | Admit: 2023-12-20 | Discharge: 2023-12-20 | Disposition: A | Discharge status: Home / Self Care | Source: Ambulatory Visit | Attending: Family Medicine | Admitting: Family Medicine

## 2023-12-20 ENCOUNTER — Ambulatory Visit: Admission: RE | Admit: 2023-12-20 | Source: Ambulatory Visit

## 2023-12-20 DIAGNOSIS — R928 Other abnormal and inconclusive findings on diagnostic imaging of breast: Secondary | ICD-10-CM

## 2023-12-22 ENCOUNTER — Other Ambulatory Visit: Payer: Self-pay | Admitting: Family Medicine

## 2023-12-22 MED ORDER — TIRZEPATIDE-WEIGHT MANAGEMENT 2.5 MG/0.5ML ~~LOC~~ SOLN: 2.5000 mg | SUBCUTANEOUS | 0 refills | Status: DC

## 2023-12-25 ENCOUNTER — Other Ambulatory Visit: Payer: Self-pay

## 2023-12-31 ENCOUNTER — Emergency Department (HOSPITAL_COMMUNITY)
Admission: EM | Admit: 2023-12-31 | Discharge: 2023-12-31 | Disposition: A | Discharge status: Home / Self Care | Attending: Emergency Medicine | Admitting: Emergency Medicine

## 2023-12-31 ENCOUNTER — Other Ambulatory Visit: Payer: Self-pay

## 2023-12-31 ENCOUNTER — Encounter (HOSPITAL_COMMUNITY): Payer: Self-pay | Admitting: Emergency Medicine

## 2023-12-31 DIAGNOSIS — W268XXA Contact with other sharp object(s), not elsewhere classified, initial encounter: Secondary | ICD-10-CM | POA: Diagnosis not present

## 2023-12-31 DIAGNOSIS — S61210A Laceration without foreign body of right index finger without damage to nail, initial encounter: Secondary | ICD-10-CM | POA: Insufficient documentation

## 2023-12-31 DIAGNOSIS — S61214A Laceration without foreign body of right ring finger without damage to nail, initial encounter: Secondary | ICD-10-CM | POA: Insufficient documentation

## 2023-12-31 DIAGNOSIS — Z23 Encounter for immunization: Secondary | ICD-10-CM | POA: Diagnosis not present

## 2023-12-31 MED ORDER — TETANUS-DIPHTH-ACELL PERTUSSIS 5-2.5-18.5 LF-MCG/0.5 IM SUSY
0.5000 mL | PREFILLED_SYRINGE | Freq: Once | INTRAMUSCULAR | Status: AC
  Administered 2023-12-31: 0.5 mL via INTRAMUSCULAR
  Filled 2023-12-31: qty 0.5

## 2023-12-31 NOTE — Discharge Instructions (Addendum)
 Return if any problems.

## 2023-12-31 NOTE — ED Triage Notes (Signed)
 Patient c/o small, superficial laceration to right 4th finger that happened while she was cooking on the grill.  Bleeding controlled with paper towel, patient does not remember last tetanus vaccine.

## 2023-12-31 NOTE — ED Provider Notes (Signed)
 [place] EMERGENCY DEPARTMENT AT [facility] Provider Note   CSN: [id number] Arrival date & time: 12/31/23  2053     History  Chief Complaint  Patient presents with   Laceration    finger    Kathleen Levy is a 43 y.o. female.  Patient complains of a laceration to her finger.  Patient reports that she was cleaning a metal tray after grilling strip and cut her finger.  Patient complains of a laceration to her right fourth finger.  Patient is unsure when her last tetanus shot was.  Patient denies any difficulty moving finger she denies any numbness.  The history is provided by the patient. No language interpreter was used.  Laceration      Home Medications Prior to Admission medications   Medication Sig Start Date End Date Taking? Authorizing Provider  ammonium lactate (AMLACTIN) 12 % cream Apply 1 Application topically as needed for dry skin. Patient not taking: Reported on 12/12/2023 07/04/23   Louann Sjogren, DPM  Black Currant Seed Oil 500 MG CAPS Take by mouth daily. Patient not taking: Reported on 12/12/2023    [provider]  cyclobenzaprine (FLEXERIL) 10 MG tablet Take 1 tablet (10 mg total) by mouth 3 (three) times daily as needed for muscle spasms. Patient not taking: Reported on 12/12/2023 12/03/20   Mayers, Cari S, PA-C  ferrous sulfate (FER-IN-SOL) 75 (15 Fe) MG/ML SOLN Take 15 mg of iron by mouth daily. Patient not taking: Reported on 05/12/2023    [provider]  ibuprofen (ADVIL) 400 MG tablet Take 1 tablet (400 mg total) by mouth every 8 (eight) hours as needed. Patient not taking: Reported on 12/12/2023 12/01/22   Mayers, Cari S, PA-C  ipratropium (ATROVENT) 0.03 % nasal spray Place 2 sprays into both nostrils every 12 (twelve) hours. Patient not taking: Reported on 12/12/2023 09/30/20   Roxy Horseman, PA-C  Magnesium 250 MG TABS Take 1 tablet by mouth once. Patient not taking: Reported on 12/12/2023    [provider]   metroNIDAZOLE (FLAGYL) 500 MG tablet Take 1 tablet (500 mg total) by mouth 2 (two) times daily. Patient not taking: Reported on 12/12/2023 10/20/22   Lorriane Shire, MD  metroNIDAZOLE (METROGEL) 0.75 % vaginal gel Place 1 Applicatorful vaginally at bedtime. Apply one applicatorful to vagina at bedtime for 5 days Patient not taking: Reported on 12/12/2023 05/17/23   Sue Lush, FNP  Multiple Vitamin (MULTIVITAMIN WITH MINERALS) TABS tablet Take 1 tablet by mouth daily. Patient not taking: Reported on 12/12/2023    [provider]  tirzepatide (ZEPBOUND) 2.5 MG/0.5ML injection vial Inject 2.5 mg into the skin once a week. 12/22/23   Georganna Skeans, MD  Vitamin D, Ergocalciferol, (DRISDOL) 1.25 MG (50000 UNIT) CAPS capsule Take 1 capsule (50,000 Units total) by mouth every 7 (seven) days. Patient not taking: Reported on 12/12/2023 12/08/20   Mayers, Kasandra Knudsen, PA-C      Allergies    Patient has no known allergies.    Review of Systems   Review of Systems  All other systems reviewed and are negative.   Physical Exam Updated Vital Signs BP (!) 120/97 (BP Location: Right Arm)   Pulse 82   Temp 99 F (37.2 C) (Oral)   Resp 18   Wt 108 kg   LMP 12/13/2023 (Approximate)   SpO2 100%   BMI 42.18 kg/m  Physical Exam Vitals reviewed.  Constitutional:      Appearance: Normal appearance.  Skin:  Comments: 6 mm laceration dorsal aspect of right fourth finger just above the DIP crease, full range of motion neurovascular neurosensory intact  Neurological:     General: No focal deficit present.     Mental Status: She is alert.  Psychiatric:        Mood and Affect: Mood normal.     ED Results / Procedures / Treatments   Labs (all labs ordered are listed, but only abnormal results are displayed) Labs Reviewed - No data to display  EKG None  Radiology No results found.  Procedures .Laceration Repair  Date/Time: 12/31/2023 10:45 PM  Performed by: Elson Areas,  PA-C Authorized by: Elson Areas, PA-C   Consent:    Consent obtained:  Verbal   Consent given by:  Patient   Risks discussed:  Infection   Alternatives discussed:  No treatment Universal protocol:    Procedure explained and questions answered to patient or proxy's satisfaction: yes   Anesthesia:    Anesthesia method:  None Laceration details:    Location:  Finger   Finger location:  R ring finger   Length (cm):  0.7   Depth (mm):  0.1 Pre-procedure details:    Preparation:  Patient was prepped and draped in usual sterile fashion Treatment:    Area cleansed with:  Chlorhexidine Skin repair:    Repair method:  Tissue adhesive Approximation:    Approximation:  Close Repair type:    Repair type:  Simple     Medications Ordered in ED Medications  Tdap (BOOSTRIX) injection 0.5 mL (0.5 mLs Intramuscular Given 12/31/23 2202)    ED Course/ Medical Decision Making/ A&P                                 Medical Decision Making Patient cut her finger on a piece of metal.  Risk OTC drugs. Risk Details: Patient has a superficial laceration to her finger.  Dermabond applied.  Patient counseled on wound care           Final Clinical Impression(s) / ED Diagnoses Final diagnoses:  Laceration of right index finger without foreign body without damage to nail, initial encounter    Rx / DC Orders ED Discharge Orders     None         Osie Cheeks 12/31/23 2246    Terald Sleeper, MD 12/31/23 [phone]

## 2023-12-31 NOTE — ED Notes (Signed)
 Patient's finger bandaged with band-aid.

## 2024-01-03 ENCOUNTER — Encounter: Admitting: Family Medicine

## 2024-01-10 ENCOUNTER — Encounter (HOSPITAL_BASED_OUTPATIENT_CLINIC_OR_DEPARTMENT_OTHER): Payer: Self-pay | Admitting: Emergency Medicine

## 2024-01-10 ENCOUNTER — Emergency Department (HOSPITAL_BASED_OUTPATIENT_CLINIC_OR_DEPARTMENT_OTHER)
Admission: EM | Admit: 2024-01-10 | Discharge: 2024-01-10 | Disposition: A | Discharge status: Home / Self Care | Attending: Emergency Medicine | Admitting: Emergency Medicine

## 2024-01-10 ENCOUNTER — Emergency Department (HOSPITAL_BASED_OUTPATIENT_CLINIC_OR_DEPARTMENT_OTHER)

## 2024-01-10 ENCOUNTER — Other Ambulatory Visit: Payer: Self-pay

## 2024-01-10 DIAGNOSIS — D259 Leiomyoma of uterus, unspecified: Secondary | ICD-10-CM | POA: Diagnosis not present

## 2024-01-10 DIAGNOSIS — R1031 Right lower quadrant pain: Secondary | ICD-10-CM | POA: Diagnosis present

## 2024-01-10 LAB — CBC
HCT: 34.2 % — ABNORMAL LOW (ref 36.0–46.0)
Hemoglobin: 11.2 g/dL — ABNORMAL LOW (ref 12.0–15.0)
MCH: 29.3 pg (ref 26.0–34.0)
MCHC: 32.7 g/dL (ref 30.0–36.0)
MCV: 89.5 fL (ref 80.0–100.0)
Platelets: 309 10*3/uL (ref 150–400)
RBC: 3.82 MIL/uL — ABNORMAL LOW (ref 3.87–5.11)
RDW: 13.4 % (ref 11.5–15.5)
WBC: 5.1 10*3/uL (ref 4.0–10.5)
nRBC: 0 % (ref 0.0–0.2)

## 2024-01-10 LAB — COMPREHENSIVE METABOLIC PANEL WITH GFR
ALT: 17 U/L (ref 0–44)
AST: 22 U/L (ref 15–41)
Albumin: 3.7 g/dL (ref 3.5–5.0)
Alkaline Phosphatase: 59 U/L (ref 38–126)
Anion gap: 11 (ref 5–15)
BUN: 13 mg/dL (ref 6–20)
CO2: 22 mmol/L (ref 22–32)
Calcium: 9.1 mg/dL (ref 8.9–10.3)
Chloride: 102 mmol/L (ref 98–111)
Creatinine, Ser: 0.97 mg/dL (ref 0.44–1.00)
GFR, Estimated: >60 mL/min (ref >60)
Glucose, Bld: 101 mg/dL — ABNORMAL HIGH (ref 70–99)
Potassium: 3.8 mmol/L (ref 3.5–5.1)
Sodium: 135 mmol/L (ref 135–145)
Total Bilirubin: 0.5 mg/dL (ref 0.0–1.2)
Total Protein: 7.6 g/dL (ref 6.5–8.1)

## 2024-01-10 LAB — URINALYSIS, ROUTINE W REFLEX MICROSCOPIC
Bilirubin Urine: NEGATIVE
Glucose, UA: NEGATIVE mg/dL
Hgb urine dipstick: NEGATIVE
Ketones, ur: NEGATIVE mg/dL
Leukocytes,Ua: NEGATIVE
Nitrite: NEGATIVE
Protein, ur: NEGATIVE mg/dL
Specific Gravity, Urine: 1.025 (ref 1.005–1.030)
pH: 5.5 (ref 5.0–8.0)

## 2024-01-10 LAB — LIPASE, BLOOD: Lipase: 31 U/L (ref 11–51)

## 2024-01-10 LAB — PREGNANCY, URINE: Preg Test, Ur: NEGATIVE

## 2024-01-10 MED ORDER — IOHEXOL 300 MG/ML  SOLN
100.0000 mL | Freq: Once | INTRAMUSCULAR | Status: AC | PRN
  Administered 2024-01-10: 100 mL via INTRAVENOUS

## 2024-01-10 NOTE — ED Provider Notes (Signed)
 [facility] Provider Note   CSN: [id number] Arrival date & time: 01/10/24  0102     History  Chief Complaint  Patient presents with   Abdominal Pain    Kathleen Levy is a 43 y.o. female.  She is here with complaints of 24 hours of intermittent crampy right lower quadrant right pain.  Occurs a few times an hour.  Not associated with anything.  Is on her period now, and no change in the amount of bleeding.  Just started exercising and doing planks and does not know if she may have injured herself.  No urinary symptoms.  No fevers nausea vomiting.  Has tried nothing for it.  Rates it as 7 out of 10 when it occurs.  Normal bowel movement.   The history is provided by the patient.  Abdominal Pain Pain location:  RLQ Pain quality: cramping   Pain radiates to:  Does not radiate Pain severity:  Moderate Onset quality:  Gradual Duration:  24 hours Timing:  Intermittent Progression:  Unchanged Chronicity:  New Relieved by:  None tried Worsened by:  Nothing Ineffective treatments:  None tried Associated symptoms: vaginal bleeding (menses)   Associated symptoms: no chest pain, no constipation, no cough, no diarrhea, no dysuria, no fever, no nausea, no vaginal discharge and no vomiting        Home Medications Prior to Admission medications   Medication Sig Start Date End Date Taking? Authorizing Provider  ammonium lactate (AMLACTIN) 12 % cream Apply 1 Application topically as needed for dry skin. Patient not taking: Reported on 12/12/2023 07/04/23   Louann Sjogren, DPM  Black Currant Seed Oil 500 MG CAPS Take by mouth daily. Patient not taking: Reported on 12/12/2023    [provider]  cyclobenzaprine (FLEXERIL) 10 MG tablet Take 1 tablet (10 mg total) by mouth 3 (three) times daily as needed for muscle spasms. Patient not taking: Reported on 12/12/2023 12/03/20   Mayers, Cari S, PA-C  ferrous sulfate (FER-IN-SOL) 75 (15 Fe) MG/ML  SOLN Take 15 mg of iron by mouth daily. Patient not taking: Reported on 05/12/2023    [provider]  ibuprofen (ADVIL) 400 MG tablet Take 1 tablet (400 mg total) by mouth every 8 (eight) hours as needed. Patient not taking: Reported on 12/12/2023 12/01/22   Mayers, Cari S, PA-C  ipratropium (ATROVENT) 0.03 % nasal spray Place 2 sprays into both nostrils every 12 (twelve) hours. Patient not taking: Reported on 12/12/2023 09/30/20   Roxy Horseman, PA-C  Magnesium 250 MG TABS Take 1 tablet by mouth once. Patient not taking: Reported on 12/12/2023    [provider]  metroNIDAZOLE (FLAGYL) 500 MG tablet Take 1 tablet (500 mg total) by mouth 2 (two) times daily. Patient not taking: Reported on 12/12/2023 10/20/22   Lorriane Shire, MD  metroNIDAZOLE (METROGEL) 0.75 % vaginal gel Place 1 Applicatorful vaginally at bedtime. Apply one applicatorful to vagina at bedtime for 5 days Patient not taking: Reported on 12/12/2023 05/17/23   Sue Lush, FNP  Multiple Vitamin (MULTIVITAMIN WITH MINERALS) TABS tablet Take 1 tablet by mouth daily. Patient not taking: Reported on 12/12/2023    [provider]  tirzepatide (ZEPBOUND) 2.5 MG/0.5ML injection vial Inject 2.5 mg into the skin once a week. 12/22/23   Georganna Skeans, MD  Vitamin D, Ergocalciferol, (DRISDOL) 1.25 MG (50000 UNIT) CAPS capsule Take 1 capsule (50,000 Units total) by mouth every 7 (seven) days. Patient not taking: Reported on  12/12/2023 12/08/20   Mayers, Kasandra Knudsen, PA-C      Allergies    Patient has no known allergies.    Review of Systems   Review of Systems  Constitutional:  Negative for fever.  Respiratory:  Negative for cough.   Cardiovascular:  Negative for chest pain.  Gastrointestinal:  Positive for abdominal pain. Negative for constipation, diarrhea, nausea and vomiting.  Genitourinary:  Positive for vaginal bleeding (menses). Negative for dysuria and vaginal discharge.    Physical Exam Updated Vital  Signs BP 120/70   Pulse 74   Temp (!) 97.3 F (36.3 C) (Oral)   Resp 16   Ht 5\' 3"  (1.6 m)   Wt 100.2 kg   LMP 01/06/2024 (Approximate)   SpO2 100%   BMI 39.15 kg/m  Physical Exam Vitals and nursing note reviewed.  Constitutional:      General: She is not in acute distress.    Appearance: Normal appearance. She is well-developed.  HENT:     Head: Normocephalic and atraumatic.  Eyes:     Conjunctiva/sclera: Conjunctivae normal.  Cardiovascular:     Rate and Rhythm: Normal rate and regular rhythm.     Heart sounds: No murmur heard. Pulmonary:     Effort: Pulmonary effort is normal. No respiratory distress.     Breath sounds: Normal breath sounds.  Abdominal:     Palpations: Abdomen is soft.     Tenderness: There is no abdominal tenderness. There is no guarding or rebound.  Musculoskeletal:        General: No swelling.     Cervical back: Neck supple.  Skin:    General: Skin is warm and dry.     Capillary Refill: Capillary refill takes less than 2 seconds.  Neurological:     General: No focal deficit present.     Mental Status: She is alert.     ED Results / Procedures / Treatments   Labs (all labs ordered are listed, but only abnormal results are displayed) Labs Reviewed  COMPREHENSIVE METABOLIC PANEL WITH GFR - Abnormal; Notable for the following components:      Result Value   Glucose, Bld 101 (*)    All other components within normal limits  CBC - Abnormal; Notable for the following components:   RBC 3.82 (*)    Hemoglobin 11.2 (*)    HCT 34.2 (*)    All other components within normal limits  LIPASE, BLOOD  URINALYSIS, ROUTINE W REFLEX MICROSCOPIC  PREGNANCY, URINE    EKG None  Radiology CT ABDOMEN PELVIS W CONTRAST Result Date: 01/10/2024 CLINICAL DATA:  Acute right lower quadrant abdominal pain. EXAM: CT ABDOMEN AND PELVIS WITH CONTRAST TECHNIQUE: Multidetector CT imaging of the abdomen and pelvis was performed using the standard protocol following  bolus administration of intravenous contrast. RADIATION DOSE REDUCTION: This exam was performed according to the departmental dose-optimization program which includes automated exposure control, adjustment of the mA and/or kV according to patient size and/or use of iterative reconstruction technique. CONTRAST:  OMNIPAQUE IOHEXOL 300 MG/ML  SOLN COMPARISON:  None Available. FINDINGS: Lower chest: No acute abnormality. Hepatobiliary: No focal liver abnormality is seen. No gallstones, gallbladder wall thickening, or biliary dilatation. Pancreas: Unremarkable. No pancreatic ductal dilatation or surrounding inflammatory changes. Spleen: Normal in size without focal abnormality. Adrenals/Urinary Tract: Adrenal glands are unremarkable. Kidneys are normal, without renal calculi, focal lesion, or hydronephrosis. Bladder is unremarkable. Stomach/Bowel: Stomach is within normal limits. Appendix appears normal. No evidence of bowel wall thickening,  distention, or inflammatory changes. Vascular/Lymphatic: No significant vascular findings are present. No enlarged abdominal or pelvic lymph nodes. Reproductive: Multiple uterine fibroids are noted. No adnexal abnormality is noted. Other: No abdominal wall hernia or abnormality. No abdominopelvic ascites. Musculoskeletal: No acute or significant osseous findings. IMPRESSION: Multiple uterine fibroids. No other abnormality seen in the abdomen or pelvis.12 Electronically Signed   By: Lupita Raider M.D.   On: 01/10/2024 10:25    Procedures Procedures    Medications Ordered in ED Medications  iohexol (OMNIPAQUE) 300 MG/ML solution 100 mL (100 mLs Intravenous Contrast Given 01/10/24 0818)    ED Course/ Medical Decision Making/ A&P Clinical Course as of 01/10/24 1653  Wed Jan 10, 2024  0803 Reviewed labs with patient.  She said she is worried this is something more serious and would like to proceed with imaging.  Have put her in for CT. [MB]    Clinical Course User  Index [MB] Terrilee Files, MD                                 Medical Decision Making Amount and/or Complexity of Data Reviewed Labs: ordered. Radiology: ordered.  Risk Prescription drug management.   This patient complains of right lower quadrant abdominal pain; this involves an extensive number of treatment Options and is a complaint that carries with it a high risk of complications and morbidity. The differential includes colitis, diverticulitis, appendicitis, UTI  I ordered, reviewed and interpreted labs, which included CBC with normal white count stable low hemoglobin, chemistries and LFTs normal, urinalysis negative, pregnancy negative I ordered imaging studies which included CT abdomen and pelvis and I independently    visualized and interpreted imaging which showed uterine fibroids otherwise no acute findings Previous records obtained and reviewed in epic including recent PCP and ED visits Cardiac monitoring reviewed, normal sinus rhythm Social determinants considered, tobacco use depression social isolation Critical Interventions: None  After the interventions stated above, I reevaluated the patient and found patient to have benign abdominal exam Admission and further testing considered, no indications for admission or further workup at this time.  Recommended symptomatic treatment and close follow-up with her GYN.  Return instructions discussed         Final Clinical Impression(s) / ED Diagnoses Final diagnoses:  RLQ abdominal pain  Uterine leiomyoma, unspecified location    Rx / DC Orders ED Discharge Orders     None         Terrilee Files, MD 01/10/24 1655

## 2024-01-10 NOTE — Discharge Instructions (Addendum)
 You are seen in the emergency department for lower abdominal pain.  You had lab work and urinalysis along with a CAT scan that did not show a definite explanation for your symptoms.  Your CAT scan did show fibroids which are enlarged muscle of the uterus.  These can cause pain.  Please follow-up with your primary care doctor and your gynecologist.  You can do Tylenol and ibuprofen for pain.  Return to the emergency department if any high fevers or worsening symptoms

## 2024-01-10 NOTE — ED Triage Notes (Addendum)
 Right lower abdominal/upper pelvic pain X 24 hours. Had been exercising day before. Is on period and yesterday had first BM in a week. Pain comes and goes.

## 2024-01-24 ENCOUNTER — Encounter: Payer: Self-pay | Admitting: Family Medicine

## 2024-01-25 ENCOUNTER — Telehealth

## 2024-01-25 ENCOUNTER — Other Ambulatory Visit: Payer: Self-pay | Admitting: Family Medicine

## 2024-01-30 ENCOUNTER — Other Ambulatory Visit: Payer: Self-pay | Admitting: Family Medicine

## 2024-01-30 ENCOUNTER — Telehealth: Payer: Self-pay

## 2024-01-30 NOTE — Telephone Encounter (Signed)
 This was sent to the wrong practice Thank you   Copied from CRM [id number]. Topic: General - Other >> Jan 29, 2024 12:42 PM Emylou G wrote: Reason for CRM: Please call patient.. checking status of zepbound..number on file is good

## 2024-01-31 ENCOUNTER — Encounter: Payer: Self-pay | Admitting: Family Medicine

## 2024-01-31 MED ORDER — TIRZEPATIDE-WEIGHT MANAGEMENT 2.5 MG/0.5ML ~~LOC~~ SOLN: 2.5000 mg | SUBCUTANEOUS | 0 refills | Status: AC

## 2024-01-31 NOTE — Telephone Encounter (Unsigned)
 Copied from CRM [id number]. Topic: General - Other >> Jan 29, 2024 12:42 PM Emylou G wrote: Reason for CRM: Please call patient.. checking status of zepbound..number on file is good

## 2024-02-01 ENCOUNTER — Other Ambulatory Visit: Payer: Self-pay | Admitting: Family Medicine

## 2024-02-01 MED ORDER — TIRZEPATIDE-WEIGHT MANAGEMENT 5 MG/0.5ML ~~LOC~~ SOLN: 5.0000 mg | SUBCUTANEOUS | 0 refills | Status: AC

## 2024-02-02 NOTE — Telephone Encounter (Signed)
 Noted.

## 2024-02-05 ENCOUNTER — Other Ambulatory Visit: Payer: Self-pay | Admitting: Family Medicine

## 2024-02-06 ENCOUNTER — Encounter: Payer: Self-pay | Admitting: Plastic Surgery

## 2024-02-06 ENCOUNTER — Ambulatory Visit: Admitting: Plastic Surgery

## 2024-02-06 VITALS — BP 119/52 | HR 72 | Ht 63.0 in | Wt 210.0 lb

## 2024-02-06 DIAGNOSIS — M545 Low back pain, unspecified: Secondary | ICD-10-CM

## 2024-02-06 DIAGNOSIS — Z803 Family history of malignant neoplasm of breast: Secondary | ICD-10-CM

## 2024-02-06 DIAGNOSIS — M542 Cervicalgia: Secondary | ICD-10-CM

## 2024-02-06 DIAGNOSIS — N62 Hypertrophy of breast: Secondary | ICD-10-CM

## 2024-02-06 DIAGNOSIS — G8929 Other chronic pain: Secondary | ICD-10-CM

## 2024-02-06 NOTE — Progress Notes (Addendum)
 Patient ID: Kathleen Levy, female    DOB: Apr 18, 1981, 43 y.o.   MRN: [id number]   Chief Complaint  Patient presents with   Advice Only   Skin Problem   Breast Problem    Mammary Hyperplasia: The patient is a 44 y.o. female with a history of mammary hyperplasia for several years.  She has extremely large breasts causing symptoms that include the following: Back pain in the upper and lower back, including neck pain. She pulls or pins her bra straps to provide better lift and relief of the pressure and pain. She notices relief by holding her breast up manually.  Her shoulder straps cause grooves and pain and pressure that requires padding for relief. Pain medication is sometimes required with motrin  and tylenol .  Activities that are hindered by enlarged breasts include: exercise and running.  She has tried supportive clothing as well as fitted bras without improvement.  Her breasts are extremely large and fairly symmetric.  She has hyperpigmentation of the inframammary area on both sides.  The sternal to nipple distance on the right is 40 cm and the left is 39 cm.  She is 5 feet 3 inches tall and weighs 210 pounds.  The BMI = 37.2 kg/m.  Preoperative bra size = I cup.  The estimated excess breast tissue to be removed at the time of surgery = 1000 grams on the left and 1000 grams on the right.  Mammogram history: 3/25 negative.  Family history of breast cancer:  paternal aunt.  Tobacco use:  none.   The patient expresses the desire to pursue surgical intervention. She has had teeth pulled and a tubal ligation without difficulty.      Review of Systems  Constitutional: Negative.   HENT: Negative.    Eyes: Negative.   Respiratory: Negative.  Negative for chest tightness and shortness of breath.   Cardiovascular: Negative.   Gastrointestinal: Negative.   Endocrine: Negative.   Genitourinary: Negative.   Musculoskeletal:  Positive for back pain and neck pain.  Skin:  Positive for rash.     Past Medical History:  Diagnosis Date   Anemia     Past Surgical History:  Procedure Laterality Date   DILATION AND CURETTAGE OF UTERUS     INDUCED ABORTION     TOOTH EXTRACTION     TUBAL LIGATION        Current Outpatient Medications:    tirzepatide  5 MG/0.5ML injection vial, Inject 5 mg into the skin once a week., Disp: 2 mL, Rfl: 0   ammonium lactate  (AMLACTIN) 12 % cream, Apply 1 Application topically as needed for dry skin. (Patient not taking: Reported on 12/12/2023), Disp: 385 g, Rfl: 0   Black Currant Seed Oil 500 MG CAPS, Take by mouth daily. (Patient not taking: Reported on 12/12/2023), Disp: , Rfl:    cyclobenzaprine  (FLEXERIL ) 10 MG tablet, Take 1 tablet (10 mg total) by mouth 3 (three) times daily as needed for muscle spasms. (Patient not taking: Reported on 12/12/2023), Disp: 30 tablet, Rfl: 0   ferrous sulfate (FER-IN-SOL) 75 (15 Fe) MG/ML SOLN, Take 15 mg of iron by mouth daily. (Patient not taking: Reported on 05/12/2023), Disp: , Rfl:    ibuprofen  (ADVIL ) 400 MG tablet, Take 1 tablet (400 mg total) by mouth every 8 (eight) hours as needed. (Patient not taking: Reported on 12/12/2023), Disp: 30 tablet, Rfl: 0   ipratropium (ATROVENT ) 0.03 % nasal spray, Place 2 sprays into both nostrils every 12 (twelve)  hours. (Patient not taking: Reported on 12/12/2023), Disp: 30 mL, Rfl: 0   Magnesium 250 MG TABS, Take 1 tablet by mouth once. (Patient not taking: Reported on 12/12/2023), Disp: , Rfl:    metroNIDAZOLE  (FLAGYL ) 500 MG tablet, Take 1 tablet (500 mg total) by mouth 2 (two) times daily. (Patient not taking: Reported on 12/12/2023), Disp: 14 tablet, Rfl: 0   metroNIDAZOLE  (METROGEL ) 0.75 % vaginal gel, Place 1 Applicatorful vaginally at bedtime. Apply one applicatorful to vagina at bedtime for 5 days (Patient not taking: Reported on 12/12/2023), Disp: 70 g, Rfl: 1   Multiple Vitamin (MULTIVITAMIN WITH MINERALS) TABS tablet, Take 1 tablet by mouth daily. (Patient not taking: Reported  on 12/12/2023), Disp: , Rfl:    tirzepatide  (ZEPBOUND ) 2.5 MG/0.5ML injection vial, Inject 2.5 mg into the skin once a week., Disp: 2 mL, Rfl: 0   Vitamin D , Ergocalciferol , (DRISDOL ) 1.25 MG (50000 UNIT) CAPS capsule, Take 1 capsule (50,000 Units total) by mouth every 7 (seven) days. (Patient not taking: Reported on 12/12/2023), Disp: 4 capsule, Rfl: 2   Objective:   Vitals:   02/06/24 0951  BP: (!) 119/52  Pulse: 72  SpO2: 98%    Physical Exam Vitals and nursing note reviewed.  Constitutional:      Appearance: Normal appearance.  HENT:     Head: Normocephalic and atraumatic.  Cardiovascular:     Rate and Rhythm: Normal rate.     Pulses: Normal pulses.  Pulmonary:     Effort: Pulmonary effort is normal.  Abdominal:     Palpations: Abdomen is soft.  Skin:    General: Skin is warm.     Capillary Refill: Capillary refill takes less than 2 seconds.  Neurological:     Mental Status: She is alert and oriented to person, place, and time.  Psychiatric:        Mood and Affect: Mood normal.        Behavior: Behavior normal.        Thought Content: Thought content normal.        Judgment: Judgment normal.     Assessment & Plan:  Symptomatic mammary hypertrophy  The procedure the patient selected and that was best for the patient was discussed. The risk were discussed and include but not limited to the following:  Breast asymmetry, fluid accumulation, firmness of the breast, inability to breast feed, loss of nipple or areola, skin loss, change in skin and nipple sensation, fat necrosis of the breast tissue, bleeding, infection and healing delay.  There are risks of anesthesia and injury to nerves or blood vessels.  Allergic reaction to tape, suture and skin glue are possible.  There will be swelling.  Any of these can lead to the need for revisional surgery which is not included in this surgery.  A breast reduction has potential to interfere with diagnostic procedures in the future.   This procedure is best done when the breast is fully developed.  Changes in the breast will continue to occur over time: pregnancy, weight gain or weigh loss. No guarantees are given for a certain bra or breast size.    Total time: 40 minutes. This includes time spent with the patient during the visit as well as time spent before and after the visit reviewing the chart, documenting the encounter, ordering pertinent studies and literature for the patient.   Physical therapy:  ordered Mammogram:  done  The patient is a good for bilateral breast reduction with liposuction.  She will  come and see us  after she has her physical therapy completed.  Pictures were obtained of the patient and placed in the chart with the patient's or guardian's permission.   Estefana RAMAN Marlyn Rabine, DO

## 2024-02-14 ENCOUNTER — Other Ambulatory Visit: Payer: Self-pay

## 2024-02-14 ENCOUNTER — Encounter: Payer: Self-pay | Admitting: Obstetrics and Gynecology

## 2024-02-14 ENCOUNTER — Ambulatory Visit: Admitting: Obstetrics and Gynecology

## 2024-02-14 VITALS — BP 100/69 | HR 82 | Wt 216.2 lb

## 2024-02-14 DIAGNOSIS — Z1331 Encounter for screening for depression: Secondary | ICD-10-CM

## 2024-02-14 DIAGNOSIS — D649 Anemia, unspecified: Secondary | ICD-10-CM | POA: Diagnosis not present

## 2024-02-14 DIAGNOSIS — D219 Benign neoplasm of connective and other soft tissue, unspecified: Secondary | ICD-10-CM | POA: Diagnosis not present

## 2024-02-14 NOTE — Progress Notes (Signed)
 GYNECOLOGY VISIT  Patient name: Kathleen Levy MRN [id number]  Date of birth: 1980/11/22 Chief Complaint:   No chief complaint on file.   History:  Kathleen Levy is a 43 y.o. [id number] being seen today for fibroid follow up.    Per patient quest portal: cholesterol 197, hdl 36, tg 164, ldl 131, hg 10.9, A1c 5.8, cr 0.89  Discussed the use of AI scribe software for clinical note transcription with the patient, who gave verbal consent to proceed.  History of Present Illness Kathleen Levy is a 43 year old female who presents with right lower abdominal pain and newly diagnosed fibroids.  She experienced a sharp, localized pain in the right lower abdomen, which prompted a visit to the emergency department. This was the first occurrence of such pain, and it has not recurred since. A CT scan revealed the presence of several fibroids. Her blood work was normal. No changes in menstrual periods or new pelvic pressure aside from the initial episode.  She is not currently sexually active but engages in non-penetrative masturbation without pain. Her Pap smear is up to date, and her recent mammogram was normal.  She has a history of anemia, which she manages with leafy greens and iron supplements, though she does not take iron daily. Her menstrual periods last about seven days, with the first two to three days being heavy. She reports feeling tired, attributing it to working multiple jobs.  She has recently changed her primary care provider to someone outside of the [facility], which has resulted in challenges accessing her medical records. Her new PCP has conducted additional blood work through [facility], but she is unsure of the specifics. She values having a provider she feels comfortable with and who is responsive to her needs.    Past Medical History:  Diagnosis Date   Anemia    High cholesterol    not medicated per pt   Uterine fibroid     Past Surgical History:  Procedure  Laterality Date   DILATION AND CURETTAGE OF UTERUS     INDUCED ABORTION     TOOTH EXTRACTION     TUBAL LIGATION      The following portions of the patient's history were reviewed and updated as appropriate: allergies, current medications, past family history, past medical history, past social history, past surgical history and problem list.   Health Maintenance:   Last pap     Component Value Date/Time   DIAGPAP  10/18/2022 1529    - Negative for intraepithelial lesion or malignancy (NILM)   DIAGPAP  05/31/2018 0000    NEGATIVE FOR INTRAEPITHELIAL LESIONS OR MALIGNANCY.   HPVHIGH Negative 10/18/2022 1529   ADEQPAP  10/18/2022 1529    Satisfactory for evaluation; transformation zone component PRESENT.   ADEQPAP  05/31/2018 0000    Satisfactory for evaluation  endocervical/transformation zone component PRESENT.    Last mammogram: 12/2023 BIRADS 1   Review of Systems:  Pertinent items are noted in HPI. Comprehensive review of systems was otherwise negative.   Objective:  Physical Exam BP 100/69   Pulse 82   Wt 216 lb 3.2 oz (98.1 kg)   LMP 02/09/2024 (Exact Date)   BMI 38.30 kg/m    Physical Exam Vitals and nursing note reviewed.  Constitutional:      Appearance: Normal appearance.  HENT:     Head: Normocephalic and atraumatic.  Pulmonary:     Effort: Pulmonary effort is normal.  Skin:  General: Skin is warm and dry.  Neurological:     General: No focal deficit present.     Mental Status: She is alert.  Psychiatric:        Mood and Affect: Mood normal.        Behavior: Behavior normal.        Thought Content: Thought content normal.        Judgment: Judgment normal.      Assessment & Plan:   Assessment & Plan Uterine fibroids Fibroids identified on CT scan, asymptomatic. Unlikely cause of acute pain. Not directly causing anemia but may contribute if heavy menstrual bleeding occurs. Pelvic ultrasound recommended for detailed evaluation. - Schedule pelvic  ultrasound. - Explained procedure involves both external and internal ultrasound.  Chronic anemia Chronic anemia with hemoglobin around 11, possibly due to heavy menstrual bleeding. Previous anemia workup incomplete. Additional labs needed to assess iron levels. Consider IV iron therapy as oral supplementation may be insufficient. - Order iron panel. - Discussed potential for therapy pending anemia work up - If iron deficiency noted, can consider IV iron transfusion and discuss menstrual managment    Routine preventative health maintenance measures emphasized.  Kiki Pelton, MD [facility]imally In[facility]nec[facility] for [facility], [facility]

## 2024-02-15 ENCOUNTER — Other Ambulatory Visit: Payer: Self-pay

## 2024-02-15 ENCOUNTER — Ambulatory Visit: Attending: Plastic Surgery | Admitting: Physical Therapy

## 2024-02-15 ENCOUNTER — Encounter: Payer: Self-pay | Admitting: Physical Therapy

## 2024-02-15 ENCOUNTER — Ambulatory Visit: Payer: Self-pay | Admitting: Obstetrics and Gynecology

## 2024-02-15 DIAGNOSIS — M542 Cervicalgia: Secondary | ICD-10-CM | POA: Insufficient documentation

## 2024-02-15 DIAGNOSIS — M25512 Pain in left shoulder: Secondary | ICD-10-CM | POA: Diagnosis not present

## 2024-02-15 DIAGNOSIS — M25511 Pain in right shoulder: Secondary | ICD-10-CM | POA: Insufficient documentation

## 2024-02-15 DIAGNOSIS — M546 Pain in thoracic spine: Secondary | ICD-10-CM | POA: Diagnosis present

## 2024-02-15 DIAGNOSIS — R293 Abnormal posture: Secondary | ICD-10-CM | POA: Diagnosis present

## 2024-02-15 DIAGNOSIS — N62 Hypertrophy of breast: Secondary | ICD-10-CM | POA: Insufficient documentation

## 2024-02-15 DIAGNOSIS — G8929 Other chronic pain: Secondary | ICD-10-CM | POA: Diagnosis not present

## 2024-02-15 LAB — IRON,TIBC AND FERRITIN PANEL
Ferritin: 137 ng/mL (ref 15–150)
Iron Saturation: 17 % (ref 15–55)
Iron: 49 ug/dL (ref 27–159)
Total Iron Binding Capacity: 293 ug/dL (ref 250–450)
UIBC: 244 ug/dL (ref 131–425)

## 2024-02-15 LAB — VITAMIN B12: Vitamin B-12: 645 pg/mL (ref 232–1245)

## 2024-02-15 NOTE — Therapy (Signed)
 OUTPATIENT PHYSICAL THERAPY CERVICAL EVALUATION   Patient Name: Kathleen Levy MRN: [id number] DOB:Aug 04, 1981, 43 y.o., female Today's Date: 02/15/2024  END OF SESSION:  PT End of Session - 02/15/24 1815     Visit Number 1    Number of Visits 7    Date for PT Re-Evaluation 03/28/24    PT Start Time 1747    PT Stop Time 1815    PT Time Calculation (min) 28 min    Activity Tolerance Patient tolerated treatment well    Behavior During Therapy [facility] for tasks assessed/performed             Past Medical History:  Diagnosis Date   Anemia    High cholesterol    not medicated per pt   Uterine fibroid    Past Surgical History:  Procedure Laterality Date   DILATION AND CURETTAGE OF UTERUS     INDUCED ABORTION     TOOTH EXTRACTION     TUBAL LIGATION     Patient Active Problem List   Diagnosis Date Noted   Symptomatic mammary hypertrophy 02/06/2024   Neck pain 02/06/2024   Elevated LDL cholesterol level 12/31/2021   Anemia 12/31/2021   Hyperlipidemia 12/08/2020   Back pain 12/04/2020   Vitamin D  deficiency 12/04/2020    PCP: Abraham Abo, MD  REFERRING PROVIDER: Thornell Flirt, DO  REFERRING DIAG:  N62 (ICD-10-CM) - Symptomatic mammary hypertrophy  M54.2 (ICD-10-CM) - Bilateral neck pain  M25.511,G89.29,M25.512 (ICD-10-CM) - Chronic pain of both shoulders    Rationale for Evaluation and Treatment: Rehabilitation  THERAPY DIAG:  Bilateral neck pain  Abnormal posture  Pain in thoracic spine  PERTINENT HISTORY: No pertinent PMH  WEIGHT BEARING RESTRICTIONS: No  FALLS:  Has patient fallen in last 6 months? 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 Currently 7/10 pain, has had pain for "years"  RED FLAGS: None   PLOF: Independent  PATIENT GOALS: fix the pain  NEXT MD VISIT: July 1st ---------------------------------------------------------------------------------------------  OBJECTIVE:  Note: Objective measures were completed at Evaluation unless otherwise noted.  DIAGNOSTIC FINDINGS:  No recent relevant imaging  PATIENT SURVEYS:  NDI 8/50(16%)  COGNITION: Overall cognitive status: Within functional limits for tasks assessed  SENSATION: WFL  POSTURE: increased thoracic kyphosis  PALPATION: No TTP   CERVICAL ROM:   Active ROM A/PROM (deg) eval  Flexion 80%  Extension 100%  Right lateral flexion 80%  Left lateral flexion 80%!  Right rotation 80%  Left rotation 80%!   (Blank rows = not tested)  ! Indicates pain with testing  UPPER EXTREMITY ROM:  Active ROM Right eval Left eval  Shoulder flexion    Shoulder extension    Shoulder abduction    Shoulder adduction    Shoulder extension    Shoulder internal rotation    Shoulder external rotation    Elbow flexion    Elbow extension    Wrist flexion    Wrist extension    Wrist ulnar deviation    Wrist radial deviation    Wrist pronation    Wrist supination     (Blank rows = not tested) ! Indicates pain with testing  UPPER EXTREMITY MMT:  MMT Right eval Left eval  Shoulder flexion    Shoulder extension    Shoulder abduction    Shoulder adduction     Shoulder extension    Shoulder internal rotation    Shoulder external rotation    Middle trapezius    Lower trapezius    Elbow flexion    Elbow extension    Wrist flexion    Wrist extension    Wrist ulnar deviation    Wrist radial deviation    Wrist pronation    Wrist supination    Grip strength     (Blank rows = not tested)  ! Indicates pain with testing   CERVICAL SPECIAL TESTS:  Spurling's test: Negative and Distraction test: Negative   [facility] Adult PT Treatment:                                                DATE: 02/15/2024  Self Care: POC discussion Pt education                                                                                                                              PATIENT EDUCATION:  Education details: Pt received education regarding HEP performance, ADL performance, functional activity tolerance, impairment education, appropriate performance of therapeutic activities. Person educated: Patient Education method: Explanation, Demonstration, Tactile cues, Verbal cues, and Handouts Education comprehension: verbalized understanding and returned demonstration  HOME EXERCISE PROGRAM: TBD ---------------------------------------------------------------------------------------------  ASSESSMENT:  CLINICAL IMPRESSION:  Eval impression (02/15/2024): Pt. attended today's physical therapy session for evaluation of upper back/cervical pain. Pt has complaints of 7/10 pain  with lifting and general activity, pt relates pain to breast hypertrophy. Pt has notable deficits with posture, posterior chain strength and thoracic spine stability.  Pt would benefit from therapeutic focus on shoulder girdle strengthening, cervical stability, thoracic stability, and posterior chain strengthening with postural endurance/education.  Treatment performed today focused on pt education detailed in obj. Pt demonstrated great understanding of education provided. required minimal  verbal/tactile cues and no physical assistance for appropriate performance with today's activities. Pt requires the intervention of skilled outpatient physical therapy to address the aforementioned deficits and progress towards a functional level in line with therapeutic goals.    OBJECTIVE IMPAIRMENTS: decreased strength, impaired flexibility, impaired UE functional use, improper body mechanics, postural dysfunction, and pain.   ACTIVITY LIMITATIONS: carrying, lifting, bending, and reach over head  PARTICIPATION LIMITATIONS: cleaning, laundry, community activity, and occupation  PERSONAL FACTORS: Fitness, Sex, Social background, and Time since onset of injury/illness/exacerbation are also affecting patient's functional outcome.   REHAB POTENTIAL: Good  CLINICAL DECISION MAKING: Stable/uncomplicated  EVALUATION COMPLEXITY: Low   GOALS: Goals reviewed with patient? Yes  SHORT TERM GOALS: Target date: 03/07/2024  Pt will be independent with administered HEP to demonstrate the competency necessary for long term managemnet of symptoms at home.  Baseline:  Goal status: INITIAL   LONG TERM GOALS: Target date: 03/28/2024    Pt. Will achieve a NDI score of 2/50 (4%) as to demonstrate improvement in self-perceived functional ability with daily activities. Baseline: 8/50(16%) Goal status: INITIAL  2.  Pt will improve global shoulder strength to a 5/5 to demonstrate improvement in strength for quality of motion and activity performance.  Baseline:  Goal status: INITIAL  3.   Pt will improve cervical AROM to 90% of standardized norms with less than 2/10 pain to demonstrate necessary mobility for high quality and safe ADLs  Baseline: see obj chart Goal status: INITIAL ---------------------------------------------------------------------------------------------  PLAN:  PT FREQUENCY: 1x/week  PT DURATION: 6 weeks  PLANNED INTERVENTIONS: 97110-Therapeutic exercises, 97530-  Therapeutic activity, 97112- Neuromuscular re-education, 97535- Self Care, 16109- Manual therapy, Patient/Family education, Taping, Dry Needling, Joint mobilization, Spinal mobilization, and DME instructions  PLAN FOR NEXT SESSION: review HEP, Begin POC as detailed In assessment.   Albesa Huguenin, PT, DPT 02/15/2024, 6:16 PM

## 2024-02-22 ENCOUNTER — Encounter: Payer: Self-pay | Admitting: Physical Therapy

## 2024-02-22 ENCOUNTER — Ambulatory Visit: Admitting: Physical Therapy

## 2024-02-22 ENCOUNTER — Ambulatory Visit (HOSPITAL_COMMUNITY)
Admission: RE | Admit: 2024-02-22 | Discharge: 2024-02-22 | Disposition: A | Discharge status: Home / Self Care | Source: Ambulatory Visit | Attending: Obstetrics and Gynecology | Admitting: Obstetrics and Gynecology

## 2024-02-22 DIAGNOSIS — M546 Pain in thoracic spine: Secondary | ICD-10-CM

## 2024-02-22 DIAGNOSIS — M542 Cervicalgia: Secondary | ICD-10-CM | POA: Diagnosis not present

## 2024-02-22 DIAGNOSIS — D219 Benign neoplasm of connective and other soft tissue, unspecified: Secondary | ICD-10-CM | POA: Insufficient documentation

## 2024-02-22 DIAGNOSIS — R293 Abnormal posture: Secondary | ICD-10-CM

## 2024-02-22 NOTE — Therapy (Signed)
 OUTPATIENT PHYSICAL THERAPY CERVICAL EVALUATION   Patient Name: Kathleen Levy MRN: [id number] DOB:06-Apr-1981, 43 y.o., female Today's Date: 02/22/2024  END OF SESSION:  PT End of Session - 02/22/24 1443     Visit Number 2    Number of Visits 7    Date for PT Re-Evaluation 03/28/24    PT Start Time 1400    PT Stop Time 1440    PT Time Calculation (min) 40 min    Activity Tolerance Patient tolerated treatment well    Behavior During Therapy [facility] for tasks assessed/performed              Past Medical History:  Diagnosis Date   Anemia    High cholesterol    not medicated per pt   Uterine fibroid    Past Surgical History:  Procedure Laterality Date   DILATION AND CURETTAGE OF UTERUS     INDUCED ABORTION     TOOTH EXTRACTION     TUBAL LIGATION     Patient Active Problem List   Diagnosis Date Noted   Symptomatic mammary hypertrophy 02/06/2024   Neck pain 02/06/2024   Elevated LDL cholesterol level 12/31/2021   Anemia 12/31/2021   Hyperlipidemia 12/08/2020   Back pain 12/04/2020   Vitamin D  deficiency 12/04/2020    PCP: Abraham Abo, MD  REFERRING PROVIDER: Thornell Flirt, DO  REFERRING DIAG:  N62 (ICD-10-CM) - Symptomatic mammary hypertrophy  M54.2 (ICD-10-CM) - Bilateral neck pain  M25.511,G89.29,M25.512 (ICD-10-CM) - Chronic pain of both shoulders    Rationale for Evaluation and Treatment: Rehabilitation  THERAPY DIAG:  Bilateral neck pain  Abnormal posture  Pain in thoracic spine  PERTINENT HISTORY: No pertinent PMH  WEIGHT BEARING RESTRICTIONS: No  FALLS:  Has patient fallen in last 6 months? No  LIVING ENVIRONMENT: Lives with: lives with their family Lives in: House/apartment Stairs: No Has following equipment at home: None  OCCUPATION: 2 jobs, work in a Museum/gallery curator, works for post office at night   PRECAUTIONS:  None ---------------------------------------------------------------------------------------------  SUBJECTIVE:                                                                                                                                                                                                         SUBJECTIVE STATEMENT: Pt attended today's session with reports of 5/10 pain. Verbalized competence with HEP performance   Eval statement 02/15/2024: Most pain with bending down and lifitng, currenlty works two jobs, wants to get a breast reduction but is willing to try therapy first. Currently 7/10 pain, has had  pain for "years"  RED FLAGS: None   PLOF: Independent  PATIENT GOALS: fix the pain  NEXT MD VISIT: July 1st ---------------------------------------------------------------------------------------------  OBJECTIVE:  Note: Objective measures were completed at Evaluation unless otherwise noted.  DIAGNOSTIC FINDINGS:  No recent relevant imaging  PATIENT SURVEYS:  NDI 8/50(16%)  COGNITION: Overall cognitive status: Within functional limits for tasks assessed  SENSATION: WFL  POSTURE: increased thoracic kyphosis  PALPATION: No TTP   CERVICAL ROM:   Active ROM A/PROM (deg) eval  Flexion 80%  Extension 100%  Right lateral flexion 80%  Left lateral flexion 80%!  Right rotation 80%  Left rotation 80%!   (Blank rows = not tested)  ! Indicates pain with testing  UPPER EXTREMITY ROM:  Active ROM Right eval Left eval  Shoulder flexion    Shoulder extension    Shoulder abduction    Shoulder adduction    Shoulder extension    Shoulder internal rotation    Shoulder external rotation    Elbow flexion    Elbow extension    Wrist flexion    Wrist extension    Wrist ulnar deviation    Wrist radial deviation    Wrist pronation    Wrist supination     (Blank rows = not tested) ! Indicates pain with testing  UPPER EXTREMITY MMT:  MMT Right eval  Left eval  Shoulder flexion    Shoulder extension    Shoulder abduction    Shoulder adduction    Shoulder extension    Shoulder internal rotation    Shoulder external rotation    Middle trapezius    Lower trapezius    Elbow flexion    Elbow extension    Wrist flexion    Wrist extension    Wrist ulnar deviation    Wrist radial deviation    Wrist pronation    Wrist supination    Grip strength     (Blank rows = not tested)  ! Indicates pain with testing   CERVICAL SPECIAL TESTS:  Spurling's test: Negative and Distraction test: Negative   [facility] Adult PT Treatment:                                                DATE: 02/22/2024  Therapeutic Exercise: Doorway stretch 3x1' UT stretch 2x1' B Therapeutic Activity: NuStep  8' for activity tolerance Banded shoulder extension  2x15, BlTB Banded shoulder retraction 2x15, BlTB Prone Y 2x10, hold 3s Updated HEP   [facility] Adult PT Treatment:                                                DATE: 02/15/2024  Self Care: POC discussion Pt education  PATIENT EDUCATION:  Education details: Pt received education regarding HEP performance, ADL performance, functional activity tolerance, impairment education, appropriate performance of therapeutic activities. Person educated: Patient Education method: Explanation, Demonstration, Tactile cues, Verbal cues, and Handouts Education comprehension: verbalized understanding and returned demonstration  HOME EXERCISE PROGRAM: Access Code: [email or web address] URL: [email or web address] Date: 02/22/2024 Prepared by: Albesa Huguenin  Exercises - Standing Shoulder Row with Anchored Resistance  - 1 x daily - 5 x weekly - 2-3 sets - 12 reps - 3s hold - Supine Deep Neck Flexor Training - Repetitions  - 1 x daily - 5 x weekly - 2-3 sets - 8 reps - 5s hold - Prone Neck  Rotation Over Table Edge  - 1 x daily - 5 x weekly - 2-3 sets - 10 reps - 2s hold - Prone Scapular Retraction Y  - 1 x daily - 5 x weekly - 2-3 sets - 10 reps - 2s hold ---------------------------------------------------------------------------------------------  ASSESSMENT:  CLINICAL IMPRESSION: Pt attended physical therapy session for continuation of treatment regarding mid back and cervical pain. Today's treatment focused on improvement of  postural endurance, thoracic motility, and shoulder girdle stability. Pt showed  good tolerance to administered treatment with no adverse effects by the end of session. Skilled intervention was utilized via activity modification for pt tolerance with task completion, functional progression/regression promoting best outcomes inline with current rehab goals, as well as moderate verbal/tactile cuing alongside no physical assistance for safe and appropriate performance of today's activities. Continue to progress as tolerated.   Eval impression (02/15/2024): Pt. attended today's physical therapy session for evaluation of upper back/cervical pain. Pt has complaints of 7/10 pain with lifting and general activity, pt relates pain to breast hypertrophy. Pt has notable deficits with posture, posterior chain strength and thoracic spine stability.  Pt would benefit from therapeutic focus on shoulder girdle strengthening, cervical stability, thoracic stability, and posterior chain strengthening with postural endurance/education.  Treatment performed today focused on pt education detailed in obj. Pt demonstrated great understanding of education provided. required minimal verbal/tactile cues and no physical assistance for appropriate performance with today's activities. Pt requires the intervention of skilled outpatient physical therapy to address the aforementioned deficits and progress towards a functional level in line with therapeutic goals.    OBJECTIVE IMPAIRMENTS:  decreased strength, impaired flexibility, impaired UE functional use, improper body mechanics, postural dysfunction, and pain.   ACTIVITY LIMITATIONS: carrying, lifting, bending, and reach over head  PARTICIPATION LIMITATIONS: cleaning, laundry, community activity, and occupation  PERSONAL FACTORS: Fitness, Sex, Social background, and Time since onset of injury/illness/exacerbation are also affecting patient's functional outcome.   REHAB POTENTIAL: Good  CLINICAL DECISION MAKING: Stable/uncomplicated  EVALUATION COMPLEXITY: Low   GOALS: Goals reviewed with patient? Yes  SHORT TERM GOALS: Target date: 03/07/2024  Pt will be independent with administered HEP to demonstrate the competency necessary for long term managemnet of symptoms at home.  Baseline:  Goal status: INITIAL   LONG TERM GOALS: Target date: 03/28/2024    Pt. Will achieve a NDI score of 2/50 (4%) as to demonstrate improvement in self-perceived functional ability with daily activities. Baseline: 8/50(16%) Goal status: INITIAL  2.  Pt will improve global shoulder strength to a 5/5 to demonstrate improvement in strength for quality of motion and activity performance.  Baseline:  Goal status: INITIAL  3.   Pt will improve cervical AROM to 90% of standardized norms with less than 2/10 pain to demonstrate necessary mobility for high quality and safe ADLs  Baseline: see obj  chart Goal status: INITIAL ---------------------------------------------------------------------------------------------  PLAN:  PT FREQUENCY: 1x/week  PT DURATION: 6 weeks  PLANNED INTERVENTIONS: 97110-Therapeutic exercises, 97530- Therapeutic activity, 97112- Neuromuscular re-education, 97535- Self Care, 16109- Manual therapy, Patient/Family education, Taping, Dry Needling, Joint mobilization, Spinal mobilization, and DME instructions  PLAN FOR NEXT SESSION: review updated HEP, progress as tolerated   Albesa Huguenin, PT, DPT 02/22/2024,  2:45 PM

## 2024-02-29 ENCOUNTER — Ambulatory Visit: Admitting: Physical Therapy

## 2024-02-29 ENCOUNTER — Encounter: Payer: Self-pay | Admitting: Physical Therapy

## 2024-02-29 DIAGNOSIS — M542 Cervicalgia: Secondary | ICD-10-CM | POA: Diagnosis not present

## 2024-02-29 DIAGNOSIS — M546 Pain in thoracic spine: Secondary | ICD-10-CM

## 2024-02-29 DIAGNOSIS — R293 Abnormal posture: Secondary | ICD-10-CM

## 2024-02-29 NOTE — Therapy (Signed)
 OUTPATIENT PHYSICAL THERAPY CERVICAL EVALUATION   Patient Name: Kathleen Levy MRN: [id number] DOB:Feb 24, 1981, 43 y.o., female Today's Date: 02/29/2024  END OF SESSION:  PT End of Session - 02/29/24 1223     Visit Number 3    Number of Visits 7    Date for PT Re-Evaluation 03/28/24    PT Start Time 1207    PT Stop Time 1247    PT Time Calculation (min) 40 min    Activity Tolerance Patient tolerated treatment well    Behavior During Therapy [facility] for tasks assessed/performed               Past Medical History:  Diagnosis Date   Anemia    High cholesterol    not medicated per pt   Uterine fibroid    Past Surgical History:  Procedure Laterality Date   DILATION AND CURETTAGE OF UTERUS     INDUCED ABORTION     TOOTH EXTRACTION     TUBAL LIGATION     Patient Active Problem List   Diagnosis Date Noted   Symptomatic mammary hypertrophy 02/06/2024   Neck pain 02/06/2024   Elevated LDL cholesterol level 12/31/2021   Anemia 12/31/2021   Hyperlipidemia 12/08/2020   Back pain 12/04/2020   Vitamin D  deficiency 12/04/2020    PCP: Abraham Abo, MD  REFERRING PROVIDER: Thornell Flirt, DO  REFERRING DIAG:  N62 (ICD-10-CM) - Symptomatic mammary hypertrophy  M54.2 (ICD-10-CM) - Bilateral neck pain  M25.511,G89.29,M25.512 (ICD-10-CM) - Chronic pain of both shoulders    Rationale for Evaluation and Treatment: Rehabilitation  THERAPY DIAG:  Bilateral neck pain  Abnormal posture  Pain in thoracic spine  PERTINENT HISTORY: No pertinent PMH  WEIGHT BEARING RESTRICTIONS: No  FALLS:  Has patient fallen in last 6 months? 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 Pt stated that they have maintained good compliance with current HEP.  Had some difficulties with HEP setup.    Eval statement 02/15/2024: Most pain with bending down and lifitng, currenlty works two jobs, wants to get  a breast reduction but is willing to try therapy first. Currently 7/10 pain, has had pain for "years"  RED FLAGS: None   PLOF: Independent  PATIENT GOALS: fix the pain  NEXT MD VISIT: July 1st ---------------------------------------------------------------------------------------------  OBJECTIVE:  Note: Objective measures were completed at Evaluation unless otherwise noted.  DIAGNOSTIC FINDINGS:  No recent relevant imaging  PATIENT SURVEYS:  NDI 8/50(16%)  COGNITION: Overall cognitive status: Within functional limits for tasks assessed  SENSATION: WFL  POSTURE: increased thoracic kyphosis  PALPATION: No TTP   CERVICAL ROM:   Active ROM A/PROM (deg) eval  Flexion 80%  Extension 100%  Right lateral flexion 80%  Left lateral flexion 80%!  Right rotation 80%  Left rotation 80%!   (Blank rows = not tested)  ! Indicates pain with testing  UPPER EXTREMITY ROM:  Active ROM Right eval Left eval  Shoulder flexion    Shoulder extension    Shoulder abduction    Shoulder adduction    Shoulder extension    Shoulder internal rotation    Shoulder external rotation    Elbow flexion    Elbow extension    Wrist flexion    Wrist extension    Wrist ulnar deviation    Wrist radial deviation    Wrist pronation    Wrist supination     (Blank rows = not tested) !  Indicates pain with testing  UPPER EXTREMITY MMT:  MMT Right eval Left eval  Shoulder flexion    Shoulder extension    Shoulder abduction    Shoulder adduction    Shoulder extension    Shoulder internal rotation    Shoulder external rotation    Middle trapezius    Lower trapezius    Elbow flexion    Elbow extension    Wrist flexion    Wrist extension    Wrist ulnar deviation    Wrist radial deviation    Wrist pronation    Wrist supination    Grip strength     (Blank rows = not tested)  ! Indicates pain with testing   CERVICAL SPECIAL TESTS:  Spurling's test: Negative and Distraction test: Negative   [facility] Adult PT Treatment:                                                DATE: 02/29/2024  Therapeutic Activity: NuStep  8' for activity tolerance Omega wide grip row 2x12, hold 3s, 35lb Omega pull down 2x12, hold 3s, 35lb PNF d2 flexion 2x12, hold 3s  [facility] Adult PT Treatment:                                                DATE: 02/22/2024  Therapeutic Exercise: Doorway stretch 3x1' UT stretch 2x1' B Therapeutic Activity: NuStep  8' for activity tolerance Banded shoulder extension  2x15, BlTB Banded shoulder retraction 2x15, BlTB Prone Y 2x10, hold 3s Updated HEP   [facility] Adult PT Treatment:                                                DATE: 02/15/2024  Self Care: POC discussion Pt education                                                                                                                              PATIENT EDUCATION:  Education details: Pt received education regarding HEP performance, ADL performance, functional activity tolerance, impairment education, appropriate performance of therapeutic activities. Person educated: Patient Education method: Explanation, Demonstration, Tactile cues, Verbal cues, and Handouts Education comprehension: verbalized understanding and returned demonstration  HOME EXERCISE PROGRAM: Access Code:  [email or web address] URL: [email or web address] Date: 02/22/2024 Prepared by: Albesa Huguenin  Exercises - Standing Shoulder Row with Anchored Resistance  - 1 x daily - 5 x weekly - 2-3 sets - 12 reps - 3s hold - Supine Deep Neck Flexor Training - Repetitions  - 1 x daily - 5 x weekly - 2-3 sets - 8 reps - 5s hold - Prone Neck Rotation Over Table Edge  - 1 x daily - 5 x weekly - 2-3 sets - 10 reps - 2s hold - Prone Scapular Retraction Y  - 1 x daily - 5 x weekly - 2-3 sets - 10 reps - 2s hold ---------------------------------------------------------------------------------------------  ASSESSMENT:  CLINICAL IMPRESSION: Pt attended physical therapy session for continuation of treatment regarding mid back and cervical pain. Today's treatment focused on improvement of  postural endurance, global shoulder/posterior chain strength, and shoulder girdle stability. Pt showed  good tolerance to administered treatment with no adverse effects by the end of session. Skilled intervention was utilized via activity modification for pt tolerance with task completion, functional progression/regression promoting best outcomes inline with current rehab goals, as well as moderate verbal/tactile cuing alongside no physical assistance for safe and appropriate performance of today's activities. Continue to progress as tolerated.   Eval impression (02/15/2024): Pt. attended today's physical therapy session for evaluation of upper back/cervical pain. Pt has complaints of 7/10 pain with lifting and general activity, pt relates pain to breast hypertrophy. Pt has notable deficits with posture, posterior chain strength and thoracic spine stability.  Pt would benefit from therapeutic focus on shoulder girdle strengthening, cervical stability, thoracic stability, and posterior chain strengthening with postural endurance/education.  Treatment performed today focused on pt education detailed in obj. Pt demonstrated great  understanding of education provided. required minimal verbal/tactile cues and no physical assistance for appropriate performance with today's activities. Pt requires the intervention of skilled outpatient physical therapy to address the aforementioned deficits and progress towards a functional level in line with therapeutic goals.    OBJECTIVE IMPAIRMENTS: decreased strength, impaired flexibility, impaired UE functional use, improper body mechanics, postural dysfunction, and pain.   ACTIVITY LIMITATIONS: carrying, lifting, bending, and reach over head  PARTICIPATION LIMITATIONS: cleaning, laundry, community activity, and occupation  PERSONAL FACTORS: Fitness, Sex, Social background, and Time since onset of injury/illness/exacerbation are also affecting patient's functional outcome.   REHAB POTENTIAL: Good  CLINICAL DECISION MAKING: Stable/uncomplicated  EVALUATION COMPLEXITY: Low   GOALS: Goals reviewed with patient? Yes  SHORT TERM GOALS: Target date: 03/07/2024  Pt will be independent with administered HEP to demonstrate the competency necessary for long term managemnet of symptoms at home.  Baseline:  Goal status: INITIAL   LONG TERM GOALS: Target date: 03/28/2024    Pt. Will achieve a NDI score of 2/50 (4%) as to demonstrate improvement in self-perceived functional ability with daily activities. Baseline: 8/50(16%) Goal status: INITIAL  2.  Pt will improve global shoulder strength to a 5/5 to demonstrate improvement in strength for quality of motion and activity performance.  Baseline:  Goal status: INITIAL  3.   Pt will improve cervical AROM to 90% of standardized norms with less than 2/10 pain to demonstrate necessary mobility for high quality and safe ADLs  Baseline: see obj chart Goal status: INITIAL ---------------------------------------------------------------------------------------------  PLAN:  PT FREQUENCY: 1x/week  PT DURATION: 6 weeks  PLANNED  INTERVENTIONS: 97110-Therapeutic exercises, 97530- Therapeutic activity, 97112- Neuromuscular re-education, 97535- Self Care, 16109- Manual therapy, Patient/Family education, Taping, Dry Needling, Joint mobilization, Spinal mobilization, and DME instructions  PLAN FOR NEXT SESSION: continue to progress as tolerated   Albesa Huguenin, PT, DPT 02/29/2024, 12:46 PM

## 2024-03-06 NOTE — Therapy (Unsigned)
 OUTPATIENT PHYSICAL THERAPY CERVICAL EVALUATION   Patient Name: Kathleen Levy MRN: [id number] DOB:[birth year], 43 y.o., female Today's Date: 03/07/2024  END OF SESSION:  PT End of Session - 03/07/24 1750     Visit Number 4    Number of Visits 7    Date for PT Re-Evaluation 03/28/24    PT Start Time 1745    PT Stop Time 1825    PT Time Calculation (min) 40 min    Activity Tolerance Patient tolerated treatment well    Behavior During Therapy [facility] for tasks assessed/performed                Past Medical History:  Diagnosis Date   Anemia    High cholesterol    not medicated per pt   Uterine fibroid    Past Surgical History:  Procedure Laterality Date   DILATION AND CURETTAGE OF UTERUS     INDUCED ABORTION     TOOTH EXTRACTION     TUBAL LIGATION     Patient Active Problem List   Diagnosis Date Noted   Symptomatic mammary hypertrophy 02/06/2024   Neck pain 02/06/2024   Elevated LDL cholesterol level 12/31/2021   Anemia 12/31/2021   Hyperlipidemia 12/08/2020   Back pain 12/04/2020   Vitamin D  deficiency 12/04/2020    PCP: Abraham Abo, MD  REFERRING PROVIDER: Thornell Flirt, DO  REFERRING DIAG:  N62 (ICD-10-CM) - Symptomatic mammary hypertrophy  M54.2 (ICD-10-CM) - Bilateral neck pain  M25.511,G89.29,M25.512 (ICD-10-CM) - Chronic pain of both shoulders    Rationale for Evaluation and Treatment: Rehabilitation  THERAPY DIAG:  Bilateral neck pain  Abnormal posture  Pain in thoracic spine  PERTINENT HISTORY: No pertinent PMH  WEIGHT BEARING RESTRICTIONS: No  FALLS:  Has patient fallen in last 6 months? No  LIVING ENVIRONMENT: Lives with: lives with their family Lives in: House/apartment Stairs: No Has following equipment at home: None  OCCUPATION: 2 jobs, work in a Museum/gallery curator, works for post office at night   PRECAUTIONS:  None ---------------------------------------------------------------------------------------------  SUBJECTIVE:                                                                                                                                                                                                         SUBJECTIVE STATEMENT: Pain level hovers around 5/10, more frustrating than painful.    Eval statement 02/15/2024: Most pain with bending down and lifitng, currenlty works two jobs, wants to get a breast reduction but is willing to try therapy first. Currently 7/10 pain, has had pain for "  years"  RED FLAGS: None   PLOF: Independent  PATIENT GOALS: fix the pain  NEXT MD VISIT: July 1st ---------------------------------------------------------------------------------------------  OBJECTIVE:  Note: Objective measures were completed at Evaluation unless otherwise noted.  DIAGNOSTIC FINDINGS:  No recent relevant imaging  PATIENT SURVEYS:  NDI 8/50(16%)  COGNITION: Overall cognitive status: Within functional limits for tasks assessed  SENSATION: WFL  POSTURE: increased thoracic kyphosis  PALPATION: No TTP   CERVICAL ROM:   Active ROM A/PROM (deg) eval  Flexion 80%  Extension 100%  Right lateral flexion 80%  Left lateral flexion 80%!  Right rotation 80%  Left rotation 80%!   (Blank rows = not tested)  ! Indicates pain with testing  UPPER EXTREMITY ROM:  Active ROM Right eval Left eval  Shoulder flexion    Shoulder extension    Shoulder abduction    Shoulder adduction    Shoulder extension    Shoulder internal rotation    Shoulder external rotation    Elbow flexion    Elbow extension    Wrist flexion    Wrist extension    Wrist ulnar deviation    Wrist radial deviation    Wrist pronation    Wrist supination     (Blank rows = not tested) ! Indicates pain with testing  UPPER EXTREMITY MMT:  MMT Right eval Left eval  Shoulder flexion     Shoulder extension    Shoulder abduction    Shoulder adduction    Shoulder extension    Shoulder internal rotation    Shoulder external rotation    Middle trapezius    Lower trapezius    Elbow flexion    Elbow extension    Wrist flexion    Wrist extension    Wrist ulnar deviation    Wrist radial deviation    Wrist pronation    Wrist supination    Grip strength     (Blank rows = not tested)  ! Indicates pain with testing   CERVICAL SPECIAL TESTS:  Spurling's test: Negative and Distraction test: Negative  [facility] Adult PT Treatment:                                                DATE: 03/07/24 Therapeutic Exercise: Nustep L2 8 min Neuromuscular re-ed: Supine OH flexion with stick 10x with breathing patterns Alternating OH flexion in supine 15/15 Open book 10/10 Therapeutic Activity: Seated hor abd YTB 15x Supine hoe abd YTB 15x B, 15/15 unilaterally Omega high row 15x 35# Omega low row 15x 35# Omega lat pulldown 35#   [facility] Adult PT Treatment:                                                DATE: 02/29/2024  Therapeutic Activity: NuStep  8' for activity tolerance Omega wide grip row 2x12, hold 3s, 35lb Omega pull down 2x12, hold 3s, 35lb PNF d2 flexion 2x12, hold 3s  [facility] Adult PT Treatment:                                                DATE:  02/22/2024  Therapeutic Exercise: Doorway stretch 3x1' UT stretch 2x1' B Therapeutic Activity: NuStep  8' for activity tolerance Banded shoulder extension  2x15, BlTB Banded shoulder retraction 2x15, BlTB Prone Y 2x10, hold 3s Updated HEP   [facility] Adult PT Treatment:                                                DATE: 02/15/2024  Self Care: POC discussion Pt education                                                                                                                              PATIENT EDUCATION:  Education details: Pt received education regarding HEP performance, ADL performance, functional  activity tolerance, impairment education, appropriate performance of therapeutic activities. Person educated: Patient Education method: Explanation, Demonstration, Tactile cues, Verbal cues, and Handouts Education comprehension: verbalized understanding and returned demonstration  HOME EXERCISE PROGRAM: Access Code: [email or web address] URL: [email or web address] Date: 02/22/2024 Prepared by: Albesa Huguenin  Exercises - Standing Shoulder Row with Anchored Resistance  - 1 x daily - 5 x weekly - 2-3 sets - 12 reps - 3s hold - Supine Deep Neck Flexor Training - Repetitions  - 1 x daily - 5 x weekly - 2-3 sets - 8 reps - 5s hold - Prone Neck Rotation Over Table Edge  - 1 x daily - 5 x weekly - 2-3 sets - 10 reps - 2s hold - Prone Scapular Retraction Y  - 1 x daily - 5 x weekly - 2-3 sets - 10 reps - 2s hold ---------------------------------------------------------------------------------------------  ASSESSMENT:  CLINICAL IMPRESSION: Session focused on postural correction and thoracic mobility.  Added light resistance with t-band and focused on breathing patterns for rib excursion.  Incorporated open book for rotational work.   Eval impression (02/15/2024): Pt. attended today's physical therapy session for evaluation of upper back/cervical pain. Pt has complaints of 7/10 pain with lifting and general activity, pt relates pain to breast hypertrophy. Pt has notable deficits with posture, posterior chain strength and thoracic spine stability.  Pt would benefit from therapeutic focus on shoulder girdle strengthening, cervical stability, thoracic stability, and posterior chain strengthening with postural endurance/education.  Treatment performed today focused on pt education detailed in obj. Pt demonstrated great understanding of education provided. required minimal verbal/tactile cues and no physical assistance for appropriate performance with today's activities. Pt requires the intervention of  skilled outpatient physical therapy to address the aforementioned deficits and progress towards a functional level in line with therapeutic goals.    OBJECTIVE IMPAIRMENTS: decreased strength, impaired flexibility, impaired UE functional use, improper body mechanics, postural dysfunction, and pain.   ACTIVITY LIMITATIONS: carrying, lifting, bending, and reach over head  PARTICIPATION LIMITATIONS: cleaning, laundry, community activity, and occupation  PERSONAL FACTORS: Fitness, Sex, Social background, and Time since onset of injury/illness/exacerbation are  also affecting patient's functional outcome.   REHAB POTENTIAL: Good  CLINICAL DECISION MAKING: Stable/uncomplicated  EVALUATION COMPLEXITY: Low   GOALS: Goals reviewed with patient? Yes  SHORT TERM GOALS: Target date: 03/07/2024  Pt will be independent with administered HEP to demonstrate the competency necessary for long term managemnet of symptoms at home.  Baseline:  Goal status: INITIAL   LONG TERM GOALS: Target date: 03/28/2024    Pt. Will achieve a NDI score of 2/50 (4%) as to demonstrate improvement in self-perceived functional ability with daily activities. Baseline: 8/50(16%) Goal status: INITIAL  2.  Pt will improve global shoulder strength to a 5/5 to demonstrate improvement in strength for quality of motion and activity performance.  Baseline:  Goal status: INITIAL  3.   Pt will improve cervical AROM to 90% of standardized norms with less than 2/10 pain to demonstrate necessary mobility for high quality and safe ADLs  Baseline: see obj chart Goal status: INITIAL ---------------------------------------------------------------------------------------------  PLAN:  PT FREQUENCY: 1x/week  PT DURATION: 6 weeks  PLANNED INTERVENTIONS: 97110-Therapeutic exercises, 97530- Therapeutic activity, 97112- Neuromuscular re-education, 97535- Self Care, 14782- Manual therapy, Patient/Family education, Taping, Dry  Needling, Joint mobilization, Spinal mobilization, and DME instructions  PLAN FOR NEXT SESSION: continue to progress as tolerated   Jeff Vonetta Foulk PT  03/07/2024, 6:25 PM

## 2024-03-07 ENCOUNTER — Ambulatory Visit: Attending: Plastic Surgery

## 2024-03-07 DIAGNOSIS — M542 Cervicalgia: Secondary | ICD-10-CM

## 2024-03-07 DIAGNOSIS — M546 Pain in thoracic spine: Secondary | ICD-10-CM | POA: Diagnosis present

## 2024-03-07 DIAGNOSIS — R293 Abnormal posture: Secondary | ICD-10-CM | POA: Diagnosis present

## 2024-03-12 NOTE — Therapy (Deleted)
 OUTPATIENT PHYSICAL THERAPY CERVICAL EVALUATION   Patient Name: Kathleen Levy MRN: [id number] DOB:1981-02-10, 43 y.o., female Today's Date: 03/12/2024  END OF SESSION:       Past Medical History:  Diagnosis Date   Anemia    High cholesterol    not medicated per pt   Uterine fibroid    Past Surgical History:  Procedure Laterality Date   DILATION AND CURETTAGE OF UTERUS     INDUCED ABORTION     TOOTH EXTRACTION     TUBAL LIGATION     Patient Active Problem List   Diagnosis Date Noted   Symptomatic mammary hypertrophy 02/06/2024   Neck pain 02/06/2024   Elevated LDL cholesterol level 12/31/2021   Anemia 12/31/2021   Hyperlipidemia 12/08/2020   Back pain 12/04/2020   Vitamin D  deficiency 12/04/2020    PCP: Abraham Abo, MD  REFERRING PROVIDER: Thornell Flirt, DO  REFERRING DIAG:  N62 (ICD-10-CM) - Symptomatic mammary hypertrophy  M54.2 (ICD-10-CM) - Bilateral neck pain  M25.511,G89.29,M25.512 (ICD-10-CM) - Chronic pain of both shoulders    Rationale for Evaluation and Treatment: Rehabilitation  THERAPY DIAG:  No diagnosis found.  PERTINENT HISTORY: No pertinent PMH  WEIGHT BEARING RESTRICTIONS: No  FALLS:  Has patient fallen in last 6 months? 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    Eval statement 02/15/2024: Most pain with bending down and lifitng,  currenlty works two jobs, wants to get a breast reduction but is willing to try therapy first. Currently 7/10 pain, has had pain for "years"  RED FLAGS: None   PLOF: Independent  PATIENT GOALS: fix the pain  NEXT MD VISIT: July 1st ---------------------------------------------------------------------------------------------  OBJECTIVE:  Note: Objective measures were completed at Evaluation unless otherwise noted.  DIAGNOSTIC FINDINGS:  No recent relevant imaging  PATIENT SURVEYS:  NDI 8/50(16%)  COGNITION: Overall cognitive status: Within functional limits for tasks assessed  SENSATION: WFL  POSTURE: increased thoracic kyphosis  PALPATION: No TTP   CERVICAL ROM:   Active ROM A/PROM (deg) eval  Flexion 80%  Extension 100%  Right lateral flexion 80%  Left lateral flexion 80%!  Right rotation 80%  Left rotation 80%!   (Blank rows = not tested)  ! Indicates pain with testing  UPPER EXTREMITY ROM:  Active ROM Right eval Left eval  Shoulder flexion    Shoulder extension    Shoulder abduction    Shoulder adduction    Shoulder extension    Shoulder internal rotation    Shoulder external rotation    Elbow flexion    Elbow extension    Wrist flexion    Wrist extension    Wrist ulnar deviation    Wrist radial deviation    Wrist pronation    Wrist supination     (Blank rows = not tested) ! Indicates pain with testing  UPPER EXTREMITY MMT:  MMT Right eval Left eval  Shoulder flexion    Shoulder extension    Shoulder abduction    Shoulder adduction    Shoulder extension    Shoulder internal rotation    Shoulder external rotation    Middle trapezius    Lower trapezius    Elbow flexion    Elbow extension    Wrist flexion    Wrist extension    Wrist ulnar deviation    Wrist radial deviation    Wrist pronation    Wrist supination    Grip strength     (Blank rows = not tested)  ! Indicates pain with testing   CERVICAL SPECIAL TESTS:   Spurling's test: Negative and Distraction test: Negative  [facility] Adult PT Treatment:                                                DATE: 03/07/24 Therapeutic Exercise: Nustep L2 8 min Neuromuscular re-ed: Supine OH flexion with stick 10x with breathing patterns Alternating OH flexion in supine 15/15 Open book 10/10 Therapeutic Activity: Seated hor abd YTB 15x Supine hoe abd YTB 15x B, 15/15 unilaterally Omega high row 15x 35# Omega low row 15x 35# Omega lat pulldown 35#   [facility] Adult PT Treatment:                                                DATE: 02/29/2024  Therapeutic Activity: NuStep  8' for activity tolerance Omega wide grip row 2x12, hold 3s, 35lb Omega pull down 2x12, hold 3s, 35lb PNF d2 flexion 2x12, hold 3s  [facility] Adult PT Treatment:                                                DATE: 02/22/2024  Therapeutic Exercise: Doorway stretch 3x1' UT stretch 2x1' B Therapeutic Activity: NuStep  8' for activity tolerance Banded shoulder extension  2x15, BlTB Banded shoulder retraction 2x15, BlTB Prone Y 2x10, hold 3s Updated HEP   [facility] Adult PT Treatment:  DATE: 02/15/2024  Self Care: POC discussion Pt education                                                                                                                              PATIENT EDUCATION:  Education details: Pt received education regarding HEP performance, ADL performance, functional activity tolerance, impairment education, appropriate performance of therapeutic activities. Person educated: Patient Education method: Explanation, Demonstration, Tactile cues, Verbal cues, and Handouts Education comprehension: verbalized understanding and returned demonstration  HOME EXERCISE PROGRAM: Access Code: [email or web address] URL: [email or web address] Date: 02/22/2024 Prepared by: Albesa Huguenin  Exercises - Standing Shoulder Row with Anchored  Resistance  - 1 x daily - 5 x weekly - 2-3 sets - 12 reps - 3s hold - Supine Deep Neck Flexor Training - Repetitions  - 1 x daily - 5 x weekly - 2-3 sets - 8 reps - 5s hold - Prone Neck Rotation Over Table Edge  - 1 x daily - 5 x weekly - 2-3 sets - 10 reps - 2s hold - Prone Scapular Retraction Y  - 1 x daily - 5 x weekly - 2-3 sets - 10 reps - 2s hold ---------------------------------------------------------------------------------------------  ASSESSMENT:  CLINICAL IMPRESSION: Session focused on postural correction and thoracic mobility.  Added light resistance with t-band and focused on breathing patterns for rib excursion.  Incorporated open book for rotational work.   Eval impression (02/15/2024): Pt. attended today's physical therapy session for evaluation of upper back/cervical pain. Pt has complaints of 7/10 pain with lifting and general activity, pt relates pain to breast hypertrophy. Pt has notable deficits with posture, posterior chain strength and thoracic spine stability.  Pt would benefit from therapeutic focus on shoulder girdle strengthening, cervical stability, thoracic stability, and posterior chain strengthening with postural endurance/education.  Treatment performed today focused on pt education detailed in obj. Pt demonstrated great understanding of education provided. required minimal verbal/tactile cues and no physical assistance for appropriate performance with today's activities. Pt requires the intervention of skilled outpatient physical therapy to address the aforementioned deficits and progress towards a functional level in line with therapeutic goals.    OBJECTIVE IMPAIRMENTS: decreased strength, impaired flexibility, impaired UE functional use, improper body mechanics, postural dysfunction, and pain.   ACTIVITY LIMITATIONS: carrying, lifting, bending, and reach over head  PARTICIPATION LIMITATIONS: cleaning, laundry, community activity, and occupation  PERSONAL  FACTORS: Fitness, Sex, Social background, and Time since onset of injury/illness/exacerbation are also affecting patient's functional outcome.   REHAB POTENTIAL: Good  CLINICAL DECISION MAKING: Stable/uncomplicated  EVALUATION COMPLEXITY: Low   GOALS: Goals reviewed with patient? Yes  SHORT TERM GOALS: Target date: 03/07/2024  Pt will be independent with administered HEP to demonstrate the competency necessary for long term managemnet of symptoms at home.  Baseline:  Goal status: INITIAL   LONG TERM GOALS: Target date: 03/28/2024    Pt. Will achieve a NDI score of 2/50 (4%) as to demonstrate improvement in self-perceived functional ability with  daily activities. Baseline: 8/50(16%) Goal status: INITIAL  2.  Pt will improve global shoulder strength to a 5/5 to demonstrate improvement in strength for quality of motion and activity performance.  Baseline:  Goal status: INITIAL  3.   Pt will improve cervical AROM to 90% of standardized norms with less than 2/10 pain to demonstrate necessary mobility for high quality and safe ADLs  Baseline: see obj chart Goal status: INITIAL ---------------------------------------------------------------------------------------------  PLAN:  PT FREQUENCY: 1x/week  PT DURATION: 6 weeks  PLANNED INTERVENTIONS: 97110-Therapeutic exercises, 97530- Therapeutic activity, 97112- Neuromuscular re-education, 97535- Self Care, [id number]- Manual therapy, Patient/Family education, Taping, Dry Needling, Joint mobilization, Spinal mobilization, and DME instructions  PLAN FOR NEXT SESSION: continue to progress as tolerated   Jeff Laureen Frederic PT  03/12/2024, 3:48 PM

## 2024-03-14 ENCOUNTER — Ambulatory Visit

## 2024-03-14 ENCOUNTER — Telehealth: Payer: Self-pay

## 2024-03-14 NOTE — Telephone Encounter (Signed)
 TC due to missed visit.  Spoke to patient who has gotten distracted with graduation ceremonies and lost track of time.  Will call front office to schedule future visits.

## 2024-03-25 NOTE — Therapy (Unsigned)
 OUTPATIENT PHYSICAL THERAPY TREATMENT NOTE   Patient Name: Kathleen Levy MRN: [id number] DOB:[birth year], 43 y.o., female Today's Date: 03/27/2024  END OF SESSION:  PT End of Session - 03/27/24 1621     Visit Number 5    Number of Visits 6    PT Start Time 1620    PT Stop Time 1700    PT Time Calculation (min) 40 min    Activity Tolerance Patient tolerated treatment well    Behavior During Therapy WFL for tasks assessed/performed              Past Medical History:  Diagnosis Date   Anemia    High cholesterol    not medicated per pt   Uterine fibroid    Past Surgical History:  Procedure Laterality Date   DILATION AND CURETTAGE OF UTERUS     INDUCED ABORTION     TOOTH EXTRACTION     TUBAL LIGATION     Patient Active Problem List   Diagnosis Date Noted   Symptomatic mammary hypertrophy 02/06/2024   Neck pain 02/06/2024   Elevated LDL cholesterol level 12/31/2021   Anemia 12/31/2021   Hyperlipidemia 12/08/2020   Back pain 12/04/2020   Vitamin D  deficiency 12/04/2020    PCP: Tanda Bleacher, MD  REFERRING PROVIDER: Lowery Estefana RAMAN, DO  REFERRING DIAG:  N62 (ICD-10-CM) - Symptomatic mammary hypertrophy  M54.2 (ICD-10-CM) - Bilateral neck pain  M25.511,G89.29,M25.512 (ICD-10-CM) - Chronic pain of both shoulders    Rationale for Evaluation and Treatment: Rehabilitation  THERAPY DIAG:  Bilateral neck pain  Abnormal posture  Pain in thoracic spine  PERTINENT HISTORY: No pertinent PMH  WEIGHT BEARING RESTRICTIONS: No  FALLS:  Has patient fallen in last 6 months? No  LIVING ENVIRONMENT: Lives with: lives with their family Lives in: House/apartment Stairs: No Has following equipment at home: None  OCCUPATION: 2 jobs, work in a Museum/gallery curator, works for post office at night   PRECAUTIONS: None ---------------------------------------------------------------------------------------------  SUBJECTIVE:                                                                                                                                                                                                          SUBJECTIVE STATEMENT: Pain level hovers around 5/10, more frustrating than painful.    Eval statement 02/15/2024: Most pain with bending down and lifitng, currenlty works two jobs, wants to get a breast reduction but is willing to try therapy first. Currently 7/10 pain, has had pain for years  RED FLAGS: None   PLOF: Independent  PATIENT GOALS: fix the pain  NEXT MD VISIT: July 1st ---------------------------------------------------------------------------------------------  OBJECTIVE:  Note: Objective measures were completed at Evaluation unless otherwise noted.  DIAGNOSTIC FINDINGS:  No recent relevant imaging  PATIENT SURVEYS:  NDI 8/50(16%)  COGNITION: Overall cognitive status: Within functional limits for tasks assessed  SENSATION: WFL  POSTURE: increased thoracic kyphosis  PALPATION: No TTP   CERVICAL ROM:   Active ROM A/PROM (deg) eval  Flexion 80%  Extension 100%  Right lateral flexion 80%  Left lateral flexion 80%!  Right rotation 80%  Left rotation 80%!   (Blank rows = not tested)  ! Indicates pain with testing  UPPER EXTREMITY ROM:  Active ROM Right eval Left eval  Shoulder flexion    Shoulder extension    Shoulder abduction    Shoulder adduction    Shoulder extension    Shoulder internal rotation    Shoulder external rotation    Elbow flexion    Elbow extension    Wrist flexion    Wrist extension    Wrist ulnar deviation    Wrist radial deviation    Wrist pronation    Wrist supination     (Blank rows = not tested) ! Indicates pain with testing  UPPER EXTREMITY MMT:  MMT Right eval Left eval  Shoulder flexion    Shoulder extension    Shoulder abduction    Shoulder adduction    Shoulder extension    Shoulder internal rotation    Shoulder external rotation     Middle trapezius    Lower trapezius    Elbow flexion    Elbow extension    Wrist flexion    Wrist extension    Wrist ulnar deviation    Wrist radial deviation    Wrist pronation    Wrist supination    Grip strength     (Blank rows = not tested)  ! Indicates pain with testing   CERVICAL SPECIAL TESTS:  Spurling's test: Negative and Distraction test: Negative  [facility] Adult PT Treatment:                                                DATE: 03/27/24 Therapeutic Exercise: Nustep L4 8 min Neuromuscular re-ed: Supine OH flexion with stick 10x with breathing patterns 2# Alternating OH flexion in supine 15/15 1# Open book 10/10 1000g ball Therapeutic Activity: Seated hor abd YTB 15x Supine hor abd RTB 15x B, 15/15 unilaterally Omega high row 15x 35# Omega low row 15x 35# Omega lat pulldown 35#   [facility] Adult PT Treatment:                                                DATE: 03/07/24 Therapeutic Exercise: Nustep L2 8 min Neuromuscular re-ed: Supine OH flexion with stick 10x with breathing patterns Alternating OH flexion in supine 15/15 Open book 10/10 Therapeutic Activity: Seated hor abd YTB 15x Supine hoe abd YTB 15x B, 15/15 unilaterally Omega high row 15x 35# Omega low row 15x 35# Omega lat pulldown 35#   [facility] Adult PT Treatment:  DATE: 02/29/2024  Therapeutic Activity: NuStep  8' for activity tolerance Omega wide grip row 2x12, hold 3s, 35lb Omega pull down 2x12, hold 3s, 35lb PNF d2 flexion 2x12, hold 3s  [facility] Adult PT Treatment:                                                DATE: 02/22/2024  Therapeutic Exercise: Doorway stretch 3x1' UT stretch 2x1' B Therapeutic Activity: NuStep  8' for activity tolerance Banded shoulder extension  2x15, BlTB Banded shoulder retraction 2x15, BlTB Prone Y 2x10, hold 3s Updated HEP   [facility] Adult PT Treatment:                                                DATE:  02/15/2024  Self Care: POC discussion Pt education                                                                                                                              PATIENT EDUCATION:  Education details: Pt received education regarding HEP performance, ADL performance, functional activity tolerance, impairment education, appropriate performance of therapeutic activities. Person educated: Patient Education method: Explanation, Demonstration, Tactile cues, Verbal cues, and Handouts Education comprehension: verbalized understanding and returned demonstration  HOME EXERCISE PROGRAM: Access Code: [email or web address] URL: [email or web address] Date: 02/22/2024 Prepared by: Mabel Kiang  Exercises - Standing Shoulder Row with Anchored Resistance  - 1 x daily - 5 x weekly - 2-3 sets - 12 reps - 3s hold - Supine Deep Neck Flexor Training - Repetitions  - 1 x daily - 5 x weekly - 2-3 sets - 8 reps - 5s hold - Prone Neck Rotation Over Table Edge  - 1 x daily - 5 x weekly - 2-3 sets - 10 reps - 2s hold - Prone Scapular Retraction Y  - 1 x daily - 5 x weekly - 2-3 sets - 10 reps - 2s hold ---------------------------------------------------------------------------------------------  ASSESSMENT:  CLINICAL IMPRESSION: Continued focus on posture and flexibility.  Increased difficulty as noted to emphasize appropriate muscle groups.  Correction needed to maintain proper form and pacing   Eval impression (02/15/2024): Pt. attended today's physical therapy session for evaluation of upper back/cervical pain. Pt has complaints of 7/10 pain with lifting and general activity, pt relates pain to breast hypertrophy. Pt has notable deficits with posture, posterior chain strength and thoracic spine stability.  Pt would benefit from therapeutic focus on shoulder girdle strengthening, cervical stability, thoracic stability, and posterior chain strengthening with postural endurance/education.   Treatment performed today focused on pt education detailed in obj. Pt demonstrated great understanding of education provided. required minimal verbal/tactile cues and no physical assistance for appropriate performance with  today's activities. Pt requires the intervention of skilled outpatient physical therapy to address the aforementioned deficits and progress towards a functional level in line with therapeutic goals.    OBJECTIVE IMPAIRMENTS: decreased strength, impaired flexibility, impaired UE functional use, improper body mechanics, postural dysfunction, and pain.   ACTIVITY LIMITATIONS: carrying, lifting, bending, and reach over head  PARTICIPATION LIMITATIONS: cleaning, laundry, community activity, and occupation  PERSONAL FACTORS: Fitness, Sex, Social background, and Time since onset of injury/illness/exacerbation are also affecting patient's functional outcome.   REHAB POTENTIAL: Good  CLINICAL DECISION MAKING: Stable/uncomplicated  EVALUATION COMPLEXITY: Low   GOALS: Goals reviewed with patient? Yes  SHORT TERM GOALS: Target date: 03/07/2024  Pt will be independent with administered HEP to demonstrate the competency necessary for long term managemnet of symptoms at home.  Baseline:  Goal status: INITIAL   LONG TERM GOALS: Target date: 03/28/2024    Pt. Will achieve a NDI score of 2/50 (4%) as to demonstrate improvement in self-perceived functional ability with daily activities. Baseline: 8/50(16%) Goal status: INITIAL  2.  Pt will improve global shoulder strength to a 5/5 to demonstrate improvement in strength for quality of motion and activity performance.  Baseline:  Goal status: INITIAL  3.   Pt will improve cervical AROM to 90% of standardized norms with less than 2/10 pain to demonstrate necessary mobility for high quality and safe ADLs  Baseline: see obj chart Goal status:  INITIAL ---------------------------------------------------------------------------------------------  PLAN:  PT FREQUENCY: 1x/week  PT DURATION: 6 weeks  PLANNED INTERVENTIONS: 97110-Therapeutic exercises, 97530- Therapeutic activity, 97112- Neuromuscular re-education, 97535- Self Care, [id number]- Manual therapy, Patient/Family education, Taping, Dry Needling, Joint mobilization, Spinal mobilization, and DME instructions  PLAN FOR NEXT SESSION: continue to progress as tolerated   Jeff Meriem Lemieux PT  03/27/2024, 4:54 PM

## 2024-03-27 ENCOUNTER — Ambulatory Visit

## 2024-03-27 DIAGNOSIS — R293 Abnormal posture: Secondary | ICD-10-CM

## 2024-03-27 DIAGNOSIS — M542 Cervicalgia: Secondary | ICD-10-CM

## 2024-03-27 DIAGNOSIS — M546 Pain in thoracic spine: Secondary | ICD-10-CM

## 2024-04-01 ENCOUNTER — Ambulatory Visit

## 2024-04-01 DIAGNOSIS — M542 Cervicalgia: Secondary | ICD-10-CM | POA: Diagnosis not present

## 2024-04-01 DIAGNOSIS — M546 Pain in thoracic spine: Secondary | ICD-10-CM

## 2024-04-01 DIAGNOSIS — R293 Abnormal posture: Secondary | ICD-10-CM

## 2024-04-01 NOTE — Therapy (Signed)
 OUTPATIENT PHYSICAL THERAPY TREATMENT NOTE   Patient Name: Kathleen Levy MRN: [id number] DOB:Sep 04, 1981, 43 y.o., female Today's Date: 04/01/2024  END OF SESSION:  PT End of Session - 04/01/24 1617     Visit Number 6    Number of Visits 6    Date for PT Re-Evaluation 03/28/24    Authorization Type [employer]    PT Start Time 1615    PT Stop Time 1655    PT Time Calculation (min) 40 min    Activity Tolerance Patient tolerated treatment well    Behavior During Therapy WFL for tasks assessed/performed               Past Medical History:  Diagnosis Date   Anemia    High cholesterol    not medicated per pt   Uterine fibroid    Past Surgical History:  Procedure Laterality Date   DILATION AND CURETTAGE OF UTERUS     INDUCED ABORTION     TOOTH EXTRACTION     TUBAL LIGATION     Patient Active Problem List   Diagnosis Date Noted   Symptomatic mammary hypertrophy 02/06/2024   Neck pain 02/06/2024   Elevated LDL cholesterol level 12/31/2021   Anemia 12/31/2021   Hyperlipidemia 12/08/2020   Back pain 12/04/2020   Vitamin D  deficiency 12/04/2020    PCP: Tanda Bleacher, MD  REFERRING PROVIDER: Lowery Estefana RAMAN, DO  REFERRING DIAG:  N62 (ICD-10-CM) - Symptomatic mammary hypertrophy  M54.2 (ICD-10-CM) - Bilateral neck pain  M25.511,G89.29,M25.512 (ICD-10-CM) - Chronic pain of both shoulders    Rationale for Evaluation and Treatment: Rehabilitation  THERAPY DIAG:  Bilateral neck pain  Abnormal posture  Pain in thoracic spine  PERTINENT HISTORY: No pertinent PMH  WEIGHT BEARING RESTRICTIONS: No  FALLS:  Has patient fallen in last 6 months? 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    Eval statement 02/15/2024: Most pain with bending down and lifitng, currenlty works two jobs, wants to get a breast reduction but is willing to try therapy first. Currently 7/10 pain,  has had pain for years  RED FLAGS: None   PLOF: Independent  PATIENT GOALS: fix the pain  NEXT MD VISIT: July 1st ---------------------------------------------------------------------------------------------  OBJECTIVE:  Note: Objective measures were completed at Evaluation unless otherwise noted.  DIAGNOSTIC FINDINGS:  No recent relevant imaging  PATIENT SURVEYS:  NDI 8/50(16%) 04/01/24 7/50 (14%)  COGNITION: Overall cognitive status: Within functional limits for tasks assessed  SENSATION: WFL  POSTURE: increased thoracic kyphosis  PALPATION: No TTP   CERVICAL ROM:   Active ROM A/PROM (deg) eval 04/01/24  Flexion 80% 90%  Extension 100% 90%  Right lateral flexion 80% 90%  Left lateral flexion 80%! 90%  Right rotation 80% 90%  Left rotation 80%! 90%   (Blank rows = not tested)  ! Indicates pain with testing  UPPER EXTREMITY ROM:  Active ROM Right eval Left eval  Shoulder flexion    Shoulder extension    Shoulder abduction    Shoulder adduction    Shoulder extension    Shoulder internal rotation    Shoulder external rotation    Elbow flexion    Elbow extension    Wrist flexion    Wrist extension    Wrist ulnar deviation    Wrist radial deviation    Wrist pronation    Wrist supination     (Blank rows = not tested) ! Indicates pain with testing  UPPER EXTREMITY MMT:  MMT  Right eval Left eval  Shoulder flexion    Shoulder extension    Shoulder abduction    Shoulder adduction    Shoulder extension    Shoulder internal rotation    Shoulder external rotation    Middle trapezius    Lower trapezius    Elbow flexion    Elbow extension    Wrist flexion    Wrist extension    Wrist ulnar deviation    Wrist radial deviation    Wrist pronation    Wrist supination    Grip strength     (Blank rows = not tested)  ! Indicates pain with testing   CERVICAL SPECIAL TESTS:  Spurling's test: Negative and Distraction test: Negative  [facility] Adult PT Treatment:                                                DATE: 04/01/24 Therapeutic Exercise: Nustep L4 8 min Neuromuscular re-ed: Supine OH flexion with stick 10x with breathing patterns 3# Alternating OH flexion in supine 15/15 2# Open book 10/10 1000g ball Therapeutic Activity: B UT stretch 30s x2 B levator stretch 30s x2 B Seated hor abd YTB 15x Supine hor abd RTB 15x B, 15/15 unilaterally  [facility] Adult PT Treatment:                                                DATE: 03/27/24 Therapeutic Exercise: Nustep L4 8 min Neuromuscular re-ed: Supine OH flexion with stick 10x with breathing patterns 2# Alternating OH flexion in supine 15/15 1# Open book 10/10 1000g ball Therapeutic Activity: Seated hor abd YTB 15x Supine hor abd RTB 15x B, 15/15 unilaterally Omega high row 15x 35# Omega low row 15x 35# Omega lat pulldown 35#   [facility] Adult PT Treatment:  DATE: 03/07/24 Therapeutic Exercise: Nustep L2 8 min Neuromuscular re-ed: Supine OH flexion with stick 10x with breathing patterns Alternating OH flexion in supine 15/15 Open book 10/10 Therapeutic Activity: Seated hor abd YTB 15x Supine hoe abd YTB 15x B, 15/15 unilaterally Omega high row 15x 35# Omega low row 15x 35# Omega lat pulldown 35#   [facility] Adult PT Treatment:                                                 DATE: 02/29/2024  Therapeutic Activity: NuStep  8' for activity tolerance Omega wide grip row 2x12, hold 3s, 35lb Omega pull down 2x12, hold 3s, 35lb PNF d2 flexion 2x12, hold 3s  [facility] Adult PT Treatment:                                                DATE: 02/22/2024  Therapeutic Exercise: Doorway stretch 3x1' UT stretch 2x1' B Therapeutic Activity: NuStep  8' for activity tolerance Banded shoulder extension  2x15, BlTB Banded shoulder retraction 2x15, BlTB Prone Y 2x10, hold 3s Updated HEP   [facility] Adult PT Treatment:                                                DATE: 02/15/2024  Self Care: POC discussion Pt education                                                                                                                              PATIENT EDUCATION:  Education details: Pt received education regarding HEP performance, ADL performance, functional activity tolerance, impairment education, appropriate performance of therapeutic activities. Person educated: Patient Education method: Explanation, Demonstration, Tactile cues, Verbal cues, and Handouts Education comprehension: verbalized understanding and returned demonstration  HOME EXERCISE PROGRAM: Access Code: [email or web address] URL: [email or web address] Date: 02/22/2024 Prepared by: Mabel Kiang  Exercises - Standing Shoulder Row with Anchored Resistance  - 1 x daily - 5 x weekly - 2-3 sets - 12 reps - 3s hold - Supine Deep Neck Flexor Training - Repetitions  - 1 x daily - 5 x weekly - 2-3 sets - 8 reps - 5s hold - Prone Neck Rotation Over Table Edge  - 1 x daily - 5 x weekly - 2-3 sets - 10 reps - 2s hold - Prone Scapular Retraction Y  - 1 x daily - 5 x weekly - 2-3 sets - 10 reps - 2s hold ---------------------------------------------------------------------------------------------  ASSESSMENT:  CLINICAL IMPRESSION:  Rehab goals met or maximum function regained.  Recommend DC to selfcare  and  MD f/u.   Eval impression (02/15/2024): Pt. attended today's physical therapy session for evaluation of upper back/cervical pain. Pt has complaints of 7/10 pain with lifting and general activity, pt relates pain to breast hypertrophy. Pt has notable deficits with posture, posterior chain strength and thoracic spine stability.  Pt would benefit from therapeutic focus on shoulder girdle strengthening, cervical stability, thoracic stability, and posterior chain strengthening with postural endurance/education.  Treatment performed today focused on pt education detailed in obj. Pt demonstrated great understanding of education provided. required minimal verbal/tactile cues and no physical assistance for appropriate performance with today's activities. Pt requires the intervention of skilled outpatient physical therapy to address the aforementioned deficits and progress towards a functional level in line with therapeutic goals.    OBJECTIVE IMPAIRMENTS: decreased strength, impaired flexibility, impaired UE functional use, improper body mechanics, postural dysfunction, and pain.   ACTIVITY LIMITATIONS: carrying, lifting, bending, and reach over head  PARTICIPATION LIMITATIONS: cleaning, laundry, community activity, and occupation  PERSONAL FACTORS: Fitness, Sex, Social background, and Time since onset of injury/illness/exacerbation are also affecting patient's functional outcome.   REHAB POTENTIAL: Good  CLINICAL DECISION MAKING: Stable/uncomplicated  EVALUATION COMPLEXITY: Low   GOALS: Goals reviewed with patient? Yes  SHORT TERM GOALS: Target date: 03/07/2024  Pt will be independent with administered HEP to demonstrate the competency necessary for long term managemnet of symptoms at home.  Baseline: [email or web address] Goal status: Met   LONG TERM GOALS: Target date: 03/28/2024    Pt. Will achieve a NDI score of 2/50 (4%) as to demonstrate improvement in self-perceived functional ability with  daily activities. Baseline: 8/50(16%); 04/01/24 7/80 Goal status: Partially met  2.  Pt will improve global shoulder strength to a 5/5 to demonstrate improvement in strength for quality of motion and activity performance.  Baseline:  Goal status: Met as evidenced by physical performance in clinic and fitness center.  3.   Pt will improve cervical AROM to 90% of standardized norms with less than 2/10 pain to demonstrate necessary mobility for high quality and safe ADLs  Baseline:  Active ROM A/PROM (deg) eval 04/01/24  Flexion 80% 90%  Extension 100% 90%  Right lateral flexion 80% 90%  Left lateral flexion 80%! 90%  Right rotation 80% 90%  Left rotation 80%! 90%   Goal status: Met ---------------------------------------------------------------------------------------------  PLAN:  PT FREQUENCY: 1x/week  PT DURATION: 6 weeks  PLANNED INTERVENTIONS: 97110-Therapeutic exercises, 97530- Therapeutic activity, 97112- Neuromuscular re-education, 97535- Self Care, 02859- Manual therapy, Patient/Family education, Taping, Dry Needling, Joint mobilization, Spinal mobilization, and DME instructions  PLAN FOR NEXT SESSION: VINCENZO Chyrl Kohut PT  04/01/2024, 4:50 PM

## 2024-04-02 ENCOUNTER — Ambulatory Visit: Admitting: Surgical

## 2024-04-02 ENCOUNTER — Encounter: Payer: Self-pay | Admitting: Surgical

## 2024-04-02 VITALS — BP 97/63 | HR 70 | Ht 63.0 in | Wt 203.0 lb

## 2024-04-02 DIAGNOSIS — N62 Hypertrophy of breast: Secondary | ICD-10-CM | POA: Diagnosis not present

## 2024-04-02 DIAGNOSIS — G8929 Other chronic pain: Secondary | ICD-10-CM

## 2024-04-02 DIAGNOSIS — M25512 Pain in left shoulder: Secondary | ICD-10-CM | POA: Diagnosis not present

## 2024-04-02 DIAGNOSIS — M542 Cervicalgia: Secondary | ICD-10-CM

## 2024-04-02 DIAGNOSIS — M25511 Pain in right shoulder: Secondary | ICD-10-CM | POA: Diagnosis not present

## 2024-04-02 NOTE — Progress Notes (Signed)
   Referring Provider Tanda Bleacher, MD [address] [place],  [place] [address]   CC:  Chief Complaint  Patient presents with   Follow-up      Kathleen Levy is an 43 y.o. female.  HPI: Patient is a 43 y.o. 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 She reports she has completed 6 visits over 6 weeks of physical therapy.  She reports she is continue to have ongoing neck upper back/shoulder pain.  She also reports that she occasionally gets rashes beneath the folds of her breasts in the summer months.  She reports she is still interested in proceeding with bilateral breast reduction surgery.  Review of Systems General: Positive back, neck and shoulder pain.  Physical Exam    04/02/2024   10:43 AM 02/14/2024   11:15 AM 02/06/2024    9:51 AM  Vitals with BMI  Height 5' 3  5' 3  Weight 203 lbs 216 lbs 3 oz 210 lbs  BMI 35.97 38.31 37.21  Systolic 97 100 119  Diastolic 63 69 52  Pulse 70 82 72    General:  No acute distress,  Alert and oriented, Non-Toxic, Normal speech and affect Psych: Normal behavior and mood   Assessment/Plan Patient is interested in pursuing surgical intervention for bilateral breast reduction. 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  Discussed with patient we would submit to insurance for authorization, discussed approval could take up to 6 weeks.   All of the patient's questions were answered to her content.  We discussed next steps.  We discussed calling with any questions or concerns.  Kathleen Levy PARAS Thayer Embleton 04/02/2024, 10:48 AM

## 2024-04-09 ENCOUNTER — Encounter: Payer: Self-pay | Admitting: Plastic Surgery

## 2024-04-23 ENCOUNTER — Ambulatory Visit: Admitting: Physician Assistant

## 2024-04-23 ENCOUNTER — Encounter: Payer: Self-pay | Admitting: Physician Assistant

## 2024-04-23 VITALS — BP 96/62 | HR 75 | Ht 63.5 in | Wt 199.1 lb

## 2024-04-23 DIAGNOSIS — Z1211 Encounter for screening for malignant neoplasm of colon: Secondary | ICD-10-CM

## 2024-04-23 DIAGNOSIS — K5900 Constipation, unspecified: Secondary | ICD-10-CM

## 2024-04-23 NOTE — Patient Instructions (Addendum)
   For constipation: Start OTC Miralax Powder Mix 1 capful in 6 to 8 ounces of a drink once daily  Recommend high-fiber diet, 30 g of fiber daily Eat fruits, vegetables, and whole grains Drink 64 ounces of water / fluids daily.   Please follow up sooner if symptoms increase or worsen  Due to recent changes in healthcare laws, you may see the results of your imaging and laboratory studies on MyChart before your provider has had a chance to review them.  We understand that in some cases there may be results that are confusing or concerning to you. Not all laboratory results come back in the same time frame and the provider may be waiting for multiple results in order to interpret others.  Please give us  48 hours in order for your provider to thoroughly review all the results before contacting the office for clarification of your results.   Thank you for trusting me with your gastrointestinal care!   Ellouise Console, PA-C _______________________________________________________  If your blood pressure at your visit was 140/90 or greater, please contact your primary care physician to follow up on this.  _______________________________________________________  If you are age 69 or older, your body mass index should be between 23-30. Your Body mass index is 34.72 kg/m. If this is out of the aforementioned range listed, please consider follow up with your Primary Care Provider.  If you are age 31 or younger, your body mass index should be between 19-25. Your Body mass index is 34.72 kg/m. If this is out of the aformentioned range listed, please consider follow up with your Primary Care Provider.   ________________________________________________________  The [facility] providers would like to encourage you to use MYCHART to communicate with providers for non-urgent requests or questions.  Due to long hold times on the telephone, sending your provider a message by [facility] may be a faster and more  efficient way to get a response.  Please allow 48 business hours for a response.  Please remember that this is for non-urgent requests.  _______________________________________________________

## 2024-04-23 NOTE — Progress Notes (Signed)
 Agree with assessment and plan as outlined.

## 2024-04-23 NOTE — Progress Notes (Signed)
 Ellouise Console, PA-C [address] [place], [place]  [address] Phone: [phone]   Gastroenterology Consultation  Referring Provider:     Tanda Bleacher, MD Primary Care Physician:  Tanda Bleacher, MD Primary Gastroenterologist:  Ellouise Console, PA-C / Elspeth Naval, MD  Reason for Consultation:     Irregular bowel habits.  Discuss colonoscopy        HPI:   Kathleen Levy is a 43 y.o. y/o female referred for consultation & management  by Tanda Bleacher, MD.    Current symptoms: Patient was started on Zepbound  4 months ago to help with weight loss.  After that she noticed increased constipation.  She has bowel movement every 2 or 3 days with small hard stools and straining.  No current treatment for constipation.  She denies abdominal pain, rectal bleeding, or family history of colon cancer.  She eats 2 meals per day and drinks protein shakes.  She has lost 41 pounds in the past 4 months intentionally.  Not eating much fruits, vegetables, or fiber.  No previous GI evaluation or colonoscopy.  No family history of colon cancer.  Patient has history of chronic normocytic anemia with hemoglobin around 11 g for the past 10 years.  Recent labs 02/14/2024 showed normal iron panel, ferritin, and vitamin B12.  Hgb 11.2.  History of uterine fibroids followed by GYN.  01/2024 CT abdomen pelvis with contrast: Multiple uterine fibroids.  No other abnormality.   Past Medical History:  Diagnosis Date   Anemia    Anxiety    Depression    High cholesterol    not medicated per pt   Uterine fibroid     Past Surgical History:  Procedure Laterality Date   DILATION AND CURETTAGE OF UTERUS     INDUCED ABORTION     TOOTH EXTRACTION     TUBAL LIGATION      Prior to Admission medications   Medication Sig Start Date End Date Taking? Authorizing Provider  ammonium lactate  (AMLACTIN) 12 % cream Apply 1 Application topically as needed for dry skin. 07/04/23   Joya Stabs, DPM  Black  Currant Seed Oil 500 MG CAPS Take by mouth daily. Patient not taking: Reported on 04/02/2024    [provider]  ferrous sulfate (FER-IN-SOL) 75 (15 Fe) MG/ML SOLN Take 15 mg of iron by mouth daily.    [provider]  ibuprofen  (ADVIL ) 400 MG tablet Take 1 tablet (400 mg total) by mouth every 8 (eight) hours as needed. 12/01/22   Mayers, Cari S, PA-C  Magnesium 250 MG TABS Take 1 tablet by mouth once. Patient not taking: Reported on 04/02/2024    [provider]  Multiple Vitamin (MULTIVITAMIN WITH MINERALS) TABS tablet Take 1 tablet by mouth daily.    [provider]  tirzepatide  (ZEPBOUND ) 2.5 MG/0.5ML injection vial Inject 2.5 mg into the skin once a week. Patient not taking: Reported on 04/02/2024 01/31/24   Tanda Bleacher, MD  tirzepatide  5 MG/0.5ML injection vial Inject 5 mg into the skin once a week. 02/01/24   Tanda Bleacher, MD  Vitamin D , Ergocalciferol , (DRISDOL ) 1.25 MG (50000 UNIT) CAPS capsule Take 1 capsule (50,000 Units total) by mouth every 7 (seven) days. 12/08/20   Mayers, Kirk RAMAN, PA-C    Family History  Problem Relation Age of Onset   Diabetes Mother    Hypertension Father    Breast cancer Paternal Aunt        @ unknown  Social History   Tobacco Use   Smoking status: Former    Types: Cigarettes   Smokeless tobacco: Never   Tobacco comments:    Cloves   Vaping Use   Vaping status: Never Used  Substance Use Topics   Alcohol use: Yes    Comment: rarely   Drug use: No    Allergies as of 04/23/2024   (No Known Allergies)    Review of Systems:    All systems reviewed and negative except where noted in HPI.   Physical Exam:  BP 96/62 (BP Location: Left Arm, Patient Position: Sitting, Cuff Size: Normal)   Pulse 75   Ht 5' 3.5 (1.613 m)   Wt 199 lb 2 oz (90.3 kg)   BMI 34.72 kg/m  No LMP recorded.  General:   Alert,  Well-developed, well-nourished, pleasant and cooperative in NAD Lungs:  Respirations even and unlabored.   Clear throughout to auscultation.   No wheezes, crackles, or rhonchi. No acute distress. Heart:  Regular rate and rhythm; no murmurs, clicks, rubs, or gallops. Abdomen:  Normal bowel sounds.  No bruits.  Soft, and non-distended without masses, hepatosplenomegaly or hernias noted.  No Tenderness.  No guarding or rebound tenderness.    Neurologic:  Alert and oriented x3;  grossly normal neurologically. Psych:  Alert and cooperative. Normal mood and affect.  Imaging Studies: No results found.  Labs: CBC    Component Value Date/Time   WBC 5.1 01/10/2024 0645   RBC 3.82 (L) 01/10/2024 0645   HGB 11.2 (L) 01/10/2024 0645   HGB 11.7 12/12/2023 0920   HCT 34.2 (L) 01/10/2024 0645   HCT 35.7 12/12/2023 0920   PLT 309 01/10/2024 0645   PLT 324 12/12/2023 0920   MCV 89.5 01/10/2024 0645   MCV 90 12/12/2023 0920   MCH 29.3 01/10/2024 0645   MCHC 32.7 01/10/2024 0645   RDW 13.4 01/10/2024 0645   RDW 13.0 12/12/2023 0920   LYMPHSABS 1.8 12/12/2023 0920   MONOABS 0.4 09/21/2013 1803   EOSABS 0.0 12/12/2023 0920   BASOSABS 0.0 12/12/2023 0920    CMP     Component Value Date/Time   NA 135 01/10/2024 0645   NA 136 12/12/2023 0920   K 3.8 01/10/2024 0645   CL 102 01/10/2024 0645   CO2 22 01/10/2024 0645   GLUCOSE 101 (H) 01/10/2024 0645   BUN 13 01/10/2024 0645   BUN 21 12/12/2023 0920   CREATININE 0.97 01/10/2024 0645   CREATININE 0.69 10/16/2015 1028   CALCIUM 9.1 01/10/2024 0645   PROT 7.6 01/10/2024 0645   PROT 7.9 12/12/2023 0920   ALBUMIN 3.7 01/10/2024 0645   ALBUMIN 4.6 12/12/2023 0920   AST 22 01/10/2024 0645   ALT 17 01/10/2024 0645   ALKPHOS 59 01/10/2024 0645   BILITOT 0.5 01/10/2024 0645   BILITOT 0.3 12/12/2023 0920   GFRNONAA >60 01/10/2024 0645   GFRAA >60 04/29/2018 2320    Assessment and Plan:   Kathleen Levy is a 42 y.o. y/o female has been referred for:  Constipation: Due to taking Zepbound   - Start OTC Miralax Powder; Mix 1 capful in 6 to 8 ounces  of a drink once daily - Recommend high-fiber diet, 30 g of fiber daily - Eat fruits, vegetables, and whole grains - Drink 64 ounces of water / fluids daily.    Colon Cancer Screening - Plan for first screening Colonoscopy at Age 29, on or after 08/07/24.  Follow up 3 months.  Ellouise Console,  PA-C

## 2024-06-17 ENCOUNTER — Ambulatory Visit (INDEPENDENT_AMBULATORY_CARE_PROVIDER_SITE_OTHER): Admitting: Student

## 2024-06-17 ENCOUNTER — Telehealth: Payer: Self-pay

## 2024-06-17 ENCOUNTER — Encounter: Payer: Self-pay | Admitting: Student

## 2024-06-17 VITALS — BP 117/77 | HR 74 | Wt 196.0 lb

## 2024-06-17 DIAGNOSIS — Z719 Counseling, unspecified: Secondary | ICD-10-CM

## 2024-06-17 DIAGNOSIS — N62 Hypertrophy of breast: Secondary | ICD-10-CM

## 2024-06-17 MED ORDER — CEPHALEXIN 500 MG PO CAPS: 500.0000 mg | ORAL_CAPSULE | Freq: Four times a day (QID) | ORAL | 0 refills | Status: AC

## 2024-06-17 MED ORDER — OXYCODONE HCL 5 MG PO TABS: 5.0000 mg | ORAL_TABLET | Freq: Four times a day (QID) | ORAL | 0 refills | Status: DC | PRN

## 2024-06-17 MED ORDER — ONDANSETRON HCL 4 MG PO TABS: 4.0000 mg | ORAL_TABLET | Freq: Three times a day (TID) | ORAL | 0 refills | Status: DC | PRN

## 2024-06-17 NOTE — Telephone Encounter (Signed)
 Surgical clearance received via fax from [facility]. Please schedule an office visit for this patient so Dr. Tanda can fill this out

## 2024-06-17 NOTE — Telephone Encounter (Signed)
 Faxed the Request for Surgical Clearance form to patient's PCP with confirmation of receipt.

## 2024-06-17 NOTE — Progress Notes (Signed)
 Patient ID: Kathleen Levy, female    DOB: 1981/07/07, 43 y.o.   MRN: [id number]  Chief Complaint  Patient presents with   Pre-op Exam      ICD-10-CM   1. Symptomatic mammary hypertrophy  N62        History of Present Illness: Kathleen Levy is a 43 y.o.  female  with a history of macromastia.  She presents for preoperative evaluation for upcoming procedure, Bilateral Breast Reduction with liposuction, scheduled for 07/04/2024 with Dr.  Lowery  The patient has not had problems with anesthesia.  Patient reports her paternal aunt has had breast cancer, she denies any personal history of breast cancer.  She denies any current history of cardiac disease and denies taking any blood thinners.  Patient does report though that recently she has been feeling breathy.  She is unable to exactly describe it and states that it may be due to her enlarged breasts.  She denies any chest pains, headaches, changes in her visions.  She states that she has been working out and reports that she has been doing okay with working out overall.  She does state that she had EKGs about a year ago which were normal.  She also does report history of panic attack and is unsure if this feeling might be related to anxiety.  I discussed with the patient that she needs to go to her primary care provider before surgery to have this worked up.  I discussed with her to call them today.  I discussed with her that if she has worsening symptoms she needs to go to the emergency room immediately.  She expressed understanding.  Patient reports that she quit smoking 3 years ago and has not smoked since then.  Patient denies taking any birth control or hormone replacement.  She does report she has history of 1 miscarriage.  She denies any personal family history of blood clots or clotting diseases.  She denies any recent surgeries, traumas or infections.  She denies any history of stroke or heart attack.  She denies any history of  Crohn's disease, ulcerative colitis.  She denies any history of COPD, asthma or cancer.  She denies any varicosities on her lower extremities.  She denies any other recent fevers, chills or changes in her health other than what she mentioned before.  Patient reports that she is currently a 38 I cup and would like to be around a C/D cup.  We discussed the limitations of how much tissue could be removed at the time of surgery and that cup size cannot be guaranteed.  She expressed understanding.  Summary of Previous Visit: Patient was seen for initial consult by Dr. Lowery on 02/06/2024.  At this visit, patient complained of upper back and neck pain due to her enlarged breasts.  On exam, her STN on the right was 40 cm and her STN on the left was 39 cm.  Her BMI was 37.2 kg/m.  Her preoperative bra size was an I cup.  The estimated amount of excess breast tissue to be removed at the time of surgery was 1000 g bilaterally.  The patient was found to be good candidate for bilateral breast reduction.  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  She states  that she plans to take 1 week off from this job.  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  She is planning to take 6 weeks off from this job.  She understands that she will have restrictions for 6 weeks.    PMH Significant for: Anemia, macromastia  Patient had hemoglobin completed back in April which was 11.2.  Patient denies any recent issues with her anemia.   Past Medical History: Allergies: No Known Allergies  Current Medications:  Current Outpatient Medications:    cephALEXin  (KEFLEX ) 500 MG capsule, Take 1 capsule (500 mg total) by mouth 4 (four) times daily for 3 days., Disp: 12  capsule, Rfl: 0   ferrous sulfate (FER-IN-SOL) 75 (15 Fe) MG/ML SOLN, Take 15 mg of iron by mouth daily., Disp: , Rfl:    ibuprofen  (ADVIL ) 400 MG tablet, Take 1 tablet (400 mg total) by mouth every 8 (eight) hours as needed., Disp: 30 tablet, Rfl: 0   Magnesium 250 MG TABS, Take 1 tablet by mouth once., Disp: , Rfl:    Multiple Vitamin (MULTIVITAMIN WITH MINERALS) TABS tablet, Take 1 tablet by mouth daily., Disp: , Rfl:    ondansetron  (ZOFRAN ) 4 MG tablet, Take 1 tablet (4 mg total) by mouth every 8 (eight) hours as needed for up to 20 doses for nausea or vomiting., Disp: 20 tablet, Rfl: 0   oxyCODONE  (ROXICODONE ) 5 MG immediate release tablet, Take 1 tablet (5 mg total) by mouth every 6 (six) hours as needed for up to 15 doses for severe pain (pain score 7-10)., Disp: 15 tablet, Rfl: 0   Vitamin D , Ergocalciferol , (DRISDOL ) 1.25 MG (50000 UNIT) CAPS capsule, Take 1 capsule (50,000 Units total) by mouth every 7 (seven) days., Disp: 4 capsule, Rfl: 2   ammonium lactate  (AMLACTIN) 12 % cream, Apply 1 Application topically as needed for dry skin. (Patient not taking: Reported on 06/17/2024), Disp: 385 g, Rfl: 0   Black Currant Seed Oil 500 MG CAPS, Take by mouth daily. (Patient not taking: Reported on 06/17/2024), Disp: , Rfl:    tirzepatide  (ZEPBOUND ) 2.5 MG/0.5ML injection vial, Inject 2.5 mg into the skin once a week. (Patient not taking: Reported on 06/17/2024), Disp: 2 mL, Rfl: 0   tirzepatide  5 MG/0.5ML injection vial, Inject 5 mg into the skin once a week. (Patient not taking: Reported on 06/17/2024), Disp: 2 mL, Rfl: 0  Past Medical Problems: Past Medical History:  Diagnosis Date   Anemia    Anxiety    Depression    High cholesterol    not medicated per pt   Uterine fibroid     Past Surgical History: Past Surgical History:  Procedure Laterality Date   DILATION AND CURETTAGE OF UTERUS     INDUCED ABORTION     TOOTH EXTRACTION     TUBAL LIGATION      Social History: Social History    Socioeconomic History   Marital status: Single    Spouse name: Not on file   Number of children: 2   Years of education: Not on file   Highest education level: Not on file  Occupational History   Not on file  Tobacco Use   Smoking status: Former    Types: Cigarettes   Smokeless tobacco: Never   Tobacco comments:    Cloves   Vaping Use   Vaping status: Never Used  Substance and Sexual Activity   Alcohol use: Yes  Comment: rarely   Drug use: No   Sexual activity: Not Currently    Birth control/protection: Surgical  Other Topics Concern   Not on file  Social History Narrative   Not on file   Social Drivers of Health   Financial Resource Strain: Low Risk  (12/13/2022)   Overall Financial Resource Strain (CARDIA)    Difficulty of Paying Living Expenses: Not very hard  Food Insecurity: No Food Insecurity (02/14/2024)   Hunger Vital Sign    Worried About Running Out of Food in the Last Year: Never true    Ran Out of Food in the Last Year: Never true  Transportation Needs: No Transportation Needs (02/14/2024)   PRAPARE - Administrator, Civil Service (Medical): No    Lack of Transportation (Non-Medical): No  Physical Activity: Insufficiently Active (12/13/2022)   Exercise Vital Sign    Days of Exercise per Week: 3 days    Minutes of Exercise per Session: 30 min  Stress: No Stress Concern Present (12/13/2022)   [employer] of Occupational Health - Occupational Stress Questionnaire    Feeling of Stress : Not at all  Social Connections: Socially Isolated (12/13/2022)   Social Connection and Isolation Panel    Frequency of Communication with Friends and Family: Three times a week    Frequency of Social Gatherings with Friends and Family: Three times a week    Attends Religious Services: Never    Active Member of Clubs or Organizations: No    Attends Banker Meetings: Never    Marital Status: Never married  Intimate Partner Violence: Not At Risk  (12/13/2022)   Humiliation, Afraid, Rape, and Kick questionnaire    Fear of Current or Ex-Partner: No    Emotionally Abused: No    Physically Abused: No    Sexually Abused: No    Family History: Family History  Problem Relation Age of Onset   Diabetes Mother    Hypertension Father    Breast cancer Paternal Aunt        @ unknown    Review of Systems: Denies any fevers or chills  Physical Exam: Vital Signs BP 117/77 (BP Location: Left Arm, Patient Position: Sitting, Cuff Size: Large)   Pulse 74   Wt 196 lb (88.9 kg)   SpO2 100%   BMI 34.18 kg/m   Physical Exam  Constitutional:      General: Not in acute distress.    Appearance: Normal appearance. Not ill-appearing.  HENT:     Head: Normocephalic and atraumatic.  Neck:     Musculoskeletal: Normal range of motion.  Cardiovascular:     Rate and Rhythm: Normal rate Pulmonary:     Effort: Pulmonary effort is normal. No respiratory distress.  Musculoskeletal: Normal range of motion.  Skin:    General: Skin is warm and dry.     Findings: No erythema or rash.  Neurological:     Mental Status: Alert and oriented to person, place, and time. Mental status is at baseline.  Psychiatric:        Mood and Affect: Mood normal.        Behavior: Behavior normal.    Assessment/Plan: The patient is scheduled for bilateral breast reduction with Dr. Lowery.  Risks, benefits, and alternatives of procedure discussed, questions answered and consent obtained.    Will send clearance to patient's PCP  Smoking Status: Quit 3 years ago; Counseling Given?  N/A Last Mammogram: 12/20/2023; Results: BI-RADS Category 1 negative  Caprini  Score: 4; Risk Factors include: Age, BMI > 25, and length of planned surgery. Recommendation for mechanical prophylaxis. Encourage early ambulation.   Pictures obtained: @consult   Post-op Rx sent to pharmacy: Oxycodone , Zofran , Keflex   I instructed the patient to hold any sort of multivitamins, vitamins  or supplements at least 1 week prior to surgery.  I discussed with her to hold her Advil  1 week prior to surgery.  Also discussed with her to hold her Zepbound  2 doses before surgery.  Patient expressed understanding.  Patient was provided with the breast reduction and General Surgical Risk consent document and Pain Medication Agreement prior to their appointment.  They had adequate time to read through the risk consent documents and Pain Medication Agreement. We also discussed them in person together during this preop appointment. All of their questions were answered to their satisfaction.  Recommended calling if they have any further questions.  Risk consent form and Pain Medication Agreement to be scanned into patient's chart.  The risk that can be encountered with breast reduction were discussed and include the following but not limited to these:  Breast asymmetry, fluid accumulation, firmness of the breast, inability to breast feed, loss of nipple or areola, skin loss, decrease or no nipple sensation, fat necrosis of the breast tissue, bleeding, infection, healing delay.  There are risks of anesthesia, changes to skin sensation and injury to nerves or blood vessels.  The muscle can be temporarily or permanently injured.  You may have an allergic reaction to tape, suture, glue, blood products which can result in skin discoloration, swelling, pain, skin lesions, poor healing.  Any of these can lead to the need for revisonal surgery or stage procedures.  A reduction has potential to interfere with diagnostic procedures.  Nipple or breast piercing can increase risks of infection.  This procedure is best done when the breast is fully developed.  Changes in the breast will continue to occur over time.  Pregnancy can alter the outcomes of previous breast reduction surgery, weight gain and weigh loss can also effect the long term appearance.   We discussed the possibility of amputation/free nipple graft technique  due to the length of her STN.  She is understanding of the possibility that we would need to transition from a pedicle technique to a free nipple graft technique intraoperatively.  We discussed the risks associated with free nipple graft breast reductions, including but not limited to failure of the graft, partial loss of the graft, loss of sensation of bilateral nipple areola, complete loss of the nipple areola graft, inability to breast-feed, postoperative wounds, ongoing wound care.  We also discussed the risks associated with the pedicle technique.  We discussed that with the pedicle technique she could develop nipple areolar necrosis which would result in loss of the nipple, this would also result in ongoing wound care and possible changes in the shape of her breast.      Electronically signed by: Estefana FORBES Peck, PA-C 06/17/2024 11:22 AM

## 2024-06-17 NOTE — H&P (View-Only) (Signed)
 Patient ID: Kathleen Levy, female    DOB: 1981/07/07, 43 y.o.   MRN: [id number]  Chief Complaint  Patient presents with   Pre-op Exam      ICD-10-CM   1. Symptomatic mammary hypertrophy  N62        History of Present Illness: Kathleen Levy is a 43 y.o.  female  with a history of macromastia.  She presents for preoperative evaluation for upcoming procedure, Bilateral Breast Reduction with liposuction, scheduled for 07/04/2024 with Dr.  Lowery  The patient has not had problems with anesthesia.  Patient reports her paternal aunt has had breast cancer, she denies any personal history of breast cancer.  She denies any current history of cardiac disease and denies taking any blood thinners.  Patient does report though that recently she has been feeling breathy.  She is unable to exactly describe it and states that it may be due to her enlarged breasts.  She denies any chest pains, headaches, changes in her visions.  She states that she has been working out and reports that she has been doing okay with working out overall.  She does state that she had EKGs about a year ago which were normal.  She also does report history of panic attack and is unsure if this feeling might be related to anxiety.  I discussed with the patient that she needs to go to her primary care provider before surgery to have this worked up.  I discussed with her to call them today.  I discussed with her that if she has worsening symptoms she needs to go to the emergency room immediately.  She expressed understanding.  Patient reports that she quit smoking 3 years ago and has not smoked since then.  Patient denies taking any birth control or hormone replacement.  She does report she has history of 1 miscarriage.  She denies any personal family history of blood clots or clotting diseases.  She denies any recent surgeries, traumas or infections.  She denies any history of stroke or heart attack.  She denies any history of  Crohn's disease, ulcerative colitis.  She denies any history of COPD, asthma or cancer.  She denies any varicosities on her lower extremities.  She denies any other recent fevers, chills or changes in her health other than what she mentioned before.  Patient reports that she is currently a 38 I cup and would like to be around a C/D cup.  We discussed the limitations of how much tissue could be removed at the time of surgery and that cup size cannot be guaranteed.  She expressed understanding.  Summary of Previous Visit: Patient was seen for initial consult by Dr. Lowery on 02/06/2024.  At this visit, patient complained of upper back and neck pain due to her enlarged breasts.  On exam, her STN on the right was 40 cm and her STN on the left was 39 cm.  Her BMI was 37.2 kg/m.  Her preoperative bra size was an I cup.  The estimated amount of excess breast tissue to be removed at the time of surgery was 1000 g bilaterally.  The patient was found to be good candidate for bilateral breast reduction.  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  She states  that she plans to take 1 week off from this job.  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  She is planning to take 6 weeks off from this job.  She understands that she will have restrictions for 6 weeks.    PMH Significant for: Anemia, macromastia  Patient had hemoglobin completed back in April which was 11.2.  Patient denies any recent issues with her anemia.   Past Medical History: Allergies: No Known Allergies  Current Medications:  Current Outpatient Medications:    cephALEXin  (KEFLEX ) 500 MG capsule, Take 1 capsule (500 mg total) by mouth 4 (four) times daily for 3 days., Disp: 12  capsule, Rfl: 0   ferrous sulfate (FER-IN-SOL) 75 (15 Fe) MG/ML SOLN, Take 15 mg of iron by mouth daily., Disp: , Rfl:    ibuprofen  (ADVIL ) 400 MG tablet, Take 1 tablet (400 mg total) by mouth every 8 (eight) hours as needed., Disp: 30 tablet, Rfl: 0   Magnesium 250 MG TABS, Take 1 tablet by mouth once., Disp: , Rfl:    Multiple Vitamin (MULTIVITAMIN WITH MINERALS) TABS tablet, Take 1 tablet by mouth daily., Disp: , Rfl:    ondansetron  (ZOFRAN ) 4 MG tablet, Take 1 tablet (4 mg total) by mouth every 8 (eight) hours as needed for up to 20 doses for nausea or vomiting., Disp: 20 tablet, Rfl: 0   oxyCODONE  (ROXICODONE ) 5 MG immediate release tablet, Take 1 tablet (5 mg total) by mouth every 6 (six) hours as needed for up to 15 doses for severe pain (pain score 7-10)., Disp: 15 tablet, Rfl: 0   Vitamin D , Ergocalciferol , (DRISDOL ) 1.25 MG (50000 UNIT) CAPS capsule, Take 1 capsule (50,000 Units total) by mouth every 7 (seven) days., Disp: 4 capsule, Rfl: 2   ammonium lactate  (AMLACTIN) 12 % cream, Apply 1 Application topically as needed for dry skin. (Patient not taking: Reported on 06/17/2024), Disp: 385 g, Rfl: 0   Black Currant Seed Oil 500 MG CAPS, Take by mouth daily. (Patient not taking: Reported on 06/17/2024), Disp: , Rfl:    tirzepatide  (ZEPBOUND ) 2.5 MG/0.5ML injection vial, Inject 2.5 mg into the skin once a week. (Patient not taking: Reported on 06/17/2024), Disp: 2 mL, Rfl: 0   tirzepatide  5 MG/0.5ML injection vial, Inject 5 mg into the skin once a week. (Patient not taking: Reported on 06/17/2024), Disp: 2 mL, Rfl: 0  Past Medical Problems: Past Medical History:  Diagnosis Date   Anemia    Anxiety    Depression    High cholesterol    not medicated per pt   Uterine fibroid     Past Surgical History: Past Surgical History:  Procedure Laterality Date   DILATION AND CURETTAGE OF UTERUS     INDUCED ABORTION     TOOTH EXTRACTION     TUBAL LIGATION      Social History: Social History    Socioeconomic History   Marital status: Single    Spouse name: Not on file   Number of children: 2   Years of education: Not on file   Highest education level: Not on file  Occupational History   Not on file  Tobacco Use   Smoking status: Former    Types: Cigarettes   Smokeless tobacco: Never   Tobacco comments:    Cloves   Vaping Use   Vaping status: Never Used  Substance and Sexual Activity   Alcohol use: Yes  Comment: rarely   Drug use: No   Sexual activity: Not Currently    Birth control/protection: Surgical  Other Topics Concern   Not on file  Social History Narrative   Not on file   Social Drivers of Health   Financial Resource Strain: Low Risk  (12/13/2022)   Overall Financial Resource Strain (CARDIA)    Difficulty of Paying Living Expenses: Not very hard  Food Insecurity: No Food Insecurity (02/14/2024)   Hunger Vital Sign    Worried About Running Out of Food in the Last Year: Never true    Ran Out of Food in the Last Year: Never true  Transportation Needs: No Transportation Needs (02/14/2024)   PRAPARE - Administrator, Civil Service (Medical): No    Lack of Transportation (Non-Medical): No  Physical Activity: Insufficiently Active (12/13/2022)   Exercise Vital Sign    Days of Exercise per Week: 3 days    Minutes of Exercise per Session: 30 min  Stress: No Stress Concern Present (12/13/2022)   [employer] of Occupational Health - Occupational Stress Questionnaire    Feeling of Stress : Not at all  Social Connections: Socially Isolated (12/13/2022)   Social Connection and Isolation Panel    Frequency of Communication with Friends and Family: Three times a week    Frequency of Social Gatherings with Friends and Family: Three times a week    Attends Religious Services: Never    Active Member of Clubs or Organizations: No    Attends Banker Meetings: Never    Marital Status: Never married  Intimate Partner Violence: Not At Risk  (12/13/2022)   Humiliation, Afraid, Rape, and Kick questionnaire    Fear of Current or Ex-Partner: No    Emotionally Abused: No    Physically Abused: No    Sexually Abused: No    Family History: Family History  Problem Relation Age of Onset   Diabetes Mother    Hypertension Father    Breast cancer Paternal Aunt        @ unknown    Review of Systems: Denies any fevers or chills  Physical Exam: Vital Signs BP 117/77 (BP Location: Left Arm, Patient Position: Sitting, Cuff Size: Large)   Pulse 74   Wt 196 lb (88.9 kg)   SpO2 100%   BMI 34.18 kg/m   Physical Exam  Constitutional:      General: Not in acute distress.    Appearance: Normal appearance. Not ill-appearing.  HENT:     Head: Normocephalic and atraumatic.  Neck:     Musculoskeletal: Normal range of motion.  Cardiovascular:     Rate and Rhythm: Normal rate Pulmonary:     Effort: Pulmonary effort is normal. No respiratory distress.  Musculoskeletal: Normal range of motion.  Skin:    General: Skin is warm and dry.     Findings: No erythema or rash.  Neurological:     Mental Status: Alert and oriented to person, place, and time. Mental status is at baseline.  Psychiatric:        Mood and Affect: Mood normal.        Behavior: Behavior normal.    Assessment/Plan: The patient is scheduled for bilateral breast reduction with Dr. Lowery.  Risks, benefits, and alternatives of procedure discussed, questions answered and consent obtained.    Will send clearance to patient's PCP  Smoking Status: Quit 3 years ago; Counseling Given?  N/A Last Mammogram: 12/20/2023; Results: BI-RADS Category 1 negative  Caprini  Score: 4; Risk Factors include: Age, BMI > 25, and length of planned surgery. Recommendation for mechanical prophylaxis. Encourage early ambulation.   Pictures obtained: @consult   Post-op Rx sent to pharmacy: Oxycodone , Zofran , Keflex   I instructed the patient to hold any sort of multivitamins, vitamins  or supplements at least 1 week prior to surgery.  I discussed with her to hold her Advil  1 week prior to surgery.  Also discussed with her to hold her Zepbound  2 doses before surgery.  Patient expressed understanding.  Patient was provided with the breast reduction and General Surgical Risk consent document and Pain Medication Agreement prior to their appointment.  They had adequate time to read through the risk consent documents and Pain Medication Agreement. We also discussed them in person together during this preop appointment. All of their questions were answered to their satisfaction.  Recommended calling if they have any further questions.  Risk consent form and Pain Medication Agreement to be scanned into patient's chart.  The risk that can be encountered with breast reduction were discussed and include the following but not limited to these:  Breast asymmetry, fluid accumulation, firmness of the breast, inability to breast feed, loss of nipple or areola, skin loss, decrease or no nipple sensation, fat necrosis of the breast tissue, bleeding, infection, healing delay.  There are risks of anesthesia, changes to skin sensation and injury to nerves or blood vessels.  The muscle can be temporarily or permanently injured.  You may have an allergic reaction to tape, suture, glue, blood products which can result in skin discoloration, swelling, pain, skin lesions, poor healing.  Any of these can lead to the need for revisonal surgery or stage procedures.  A reduction has potential to interfere with diagnostic procedures.  Nipple or breast piercing can increase risks of infection.  This procedure is best done when the breast is fully developed.  Changes in the breast will continue to occur over time.  Pregnancy can alter the outcomes of previous breast reduction surgery, weight gain and weigh loss can also effect the long term appearance.   We discussed the possibility of amputation/free nipple graft technique  due to the length of her STN.  She is understanding of the possibility that we would need to transition from a pedicle technique to a free nipple graft technique intraoperatively.  We discussed the risks associated with free nipple graft breast reductions, including but not limited to failure of the graft, partial loss of the graft, loss of sensation of bilateral nipple areola, complete loss of the nipple areola graft, inability to breast-feed, postoperative wounds, ongoing wound care.  We also discussed the risks associated with the pedicle technique.  We discussed that with the pedicle technique she could develop nipple areolar necrosis which would result in loss of the nipple, this would also result in ongoing wound care and possible changes in the shape of her breast.      Electronically signed by: Estefana FORBES Peck, PA-C 06/17/2024 11:22 AM

## 2024-06-17 NOTE — Telephone Encounter (Signed)
Called pt and scheduled for OV tomorrow

## 2024-06-18 ENCOUNTER — Ambulatory Visit: Admitting: Family Medicine

## 2024-06-18 ENCOUNTER — Encounter: Payer: Self-pay | Admitting: Family Medicine

## 2024-06-18 VITALS — BP 116/75 | HR 70 | Ht 63.5 in | Wt 198.0 lb

## 2024-06-18 DIAGNOSIS — Z87891 Personal history of nicotine dependence: Secondary | ICD-10-CM

## 2024-06-18 DIAGNOSIS — Z01818 Encounter for other preprocedural examination: Secondary | ICD-10-CM | POA: Diagnosis not present

## 2024-06-18 DIAGNOSIS — N62 Hypertrophy of breast: Secondary | ICD-10-CM

## 2024-06-18 DIAGNOSIS — Z0289 Encounter for other administrative examinations: Secondary | ICD-10-CM

## 2024-06-18 NOTE — Progress Notes (Unsigned)
 Established Patient Office Visit  Subjective    Patient ID: Kathleen Levy, female    DOB: June 19, 1981  Age: 43 y.o. MRN: [id number]  CC:  Chief Complaint  Patient presents with   surgical clearance form     HPI Kathleen Levy presents for medical clearance for bilateral breast reduction surgery with Lipo. This was requested by Dr. Lowery at [facility]. Patient denies acute complaints.   Outpatient Encounter Medications as of 06/18/2024  Medication Sig   ammonium lactate  (AMLACTIN) 12 % cream Apply 1 Application topically as needed for dry skin. (Patient not taking: Reported on 06/17/2024)   Black Currant Seed Oil 500 MG CAPS Take by mouth daily. (Patient not taking: Reported on 06/17/2024)   cephALEXin  (KEFLEX ) 500 MG capsule Take 1 capsule (500 mg total) by mouth 4 (four) times daily for 3 days. (Patient not taking: Reported on 06/18/2024)   ferrous sulfate (FER-IN-SOL) 75 (15 Fe) MG/ML SOLN Take 15 mg of iron by mouth daily. (Patient not taking: Reported on 06/18/2024)   ibuprofen  (ADVIL ) 400 MG tablet Take 1 tablet (400 mg total) by mouth every 8 (eight) hours as needed. (Patient not taking: Reported on 06/18/2024)   Magnesium 250 MG TABS Take 1 tablet by mouth once. (Patient not taking: Reported on 06/18/2024)   Multiple Vitamin (MULTIVITAMIN WITH MINERALS) TABS tablet Take 1 tablet by mouth daily. (Patient not taking: Reported on 06/18/2024)   ondansetron  (ZOFRAN ) 4 MG tablet Take 1 tablet (4 mg total) by mouth every 8 (eight) hours as needed for up to 20 doses for nausea or vomiting. (Patient not taking: Reported on 06/18/2024)   oxyCODONE  (ROXICODONE ) 5 MG immediate release tablet Take 1 tablet (5 mg total) by mouth every 6 (six) hours as needed for up to 15 doses for severe pain (pain score 7-10). (Patient not taking: Reported on 06/18/2024)   tirzepatide  (ZEPBOUND ) 2.5 MG/0.5ML injection vial Inject 2.5 mg into the skin once a week. (Patient not taking: Reported on  06/17/2024)   tirzepatide  5 MG/0.5ML injection vial Inject 5 mg into the skin once a week. (Patient not taking: Reported on 06/17/2024)   Vitamin D , Ergocalciferol , (DRISDOL ) 1.25 MG (50000 UNIT) CAPS capsule Take 1 capsule (50,000 Units total) by mouth every 7 (seven) days. (Patient not taking: Reported on 06/18/2024)   No facility-administered encounter medications on file as of 06/18/2024.    Past Medical History:  Diagnosis Date   Anemia    Anxiety    Depression    High cholesterol    not medicated per pt   Uterine fibroid     Past Surgical History:  Procedure Laterality Date   DILATION AND CURETTAGE OF UTERUS     INDUCED ABORTION     TOOTH EXTRACTION     TUBAL LIGATION      Family History  Problem Relation Age of Onset   Diabetes Mother    Hypertension Father    Breast cancer Paternal Aunt        @ unknown    Social History   Socioeconomic History   Marital status: Single    Spouse name: Not on file   Number of children: 2   Years of education: Not on file   Highest education level: Not on file  Occupational History   Not on file  Tobacco Use   Smoking status: Former    Types: Cigarettes   Smokeless tobacco: Never   Tobacco comments:    Cloves   Vaping Use  Vaping status: Never Used  Substance and Sexual Activity   Alcohol use: Yes    Comment: rarely   Drug use: No   Sexual activity: Not Currently    Birth control/protection: Surgical  Other Topics Concern   Not on file  Social History Narrative   Not on file   Social Drivers of Health   Financial Resource Strain: Low Risk  (12/13/2022)   Overall Financial Resource Strain (CARDIA)    Difficulty of Paying Living Expenses: Not very hard  Food Insecurity: No Food Insecurity (02/14/2024)   Hunger Vital Sign    Worried About Running Out of Food in the Last Year: Never true    Ran Out of Food in the Last Year: Never true  Transportation Needs: No Transportation Needs (02/14/2024)   PRAPARE -  Administrator, Civil Service (Medical): No    Lack of Transportation (Non-Medical): No  Physical Activity: Insufficiently Active (12/13/2022)   Exercise Vital Sign    Days of Exercise per Week: 3 days    Minutes of Exercise per Session: 30 min  Stress: No Stress Concern Present (12/13/2022)   [employer] of Occupational Health - Occupational Stress Questionnaire    Feeling of Stress : Not at all  Social Connections: Socially Isolated (12/13/2022)   Social Connection and Isolation Panel    Frequency of Communication with Friends and Family: Three times a week    Frequency of Social Gatherings with Friends and Family: Three times a week    Attends Religious Services: Never    Active Member of Clubs or Organizations: No    Attends Banker Meetings: Never    Marital Status: Never married  Intimate Partner Violence: Not At Risk (12/13/2022)   Humiliation, Afraid, Rape, and Kick questionnaire    Fear of Current or Ex-Partner: No    Emotionally Abused: No    Physically Abused: No    Sexually Abused: No    Review of Systems  All other systems reviewed and are negative.       Objective    BP 116/75   Pulse 70   Ht 5' 3.5 (1.613 m)   Wt 198 lb (89.8 kg)   LMP 06/04/2024 (Approximate)   SpO2 98%   BMI 34.52 kg/m   Physical Exam Vitals and nursing note reviewed.  Constitutional:      General: She is not in acute distress. Cardiovascular:     Rate and Rhythm: Normal rate and regular rhythm.  Pulmonary:     Effort: Pulmonary effort is normal.     Breath sounds: Normal breath sounds.  Chest:     Comments: Large breasts bilaterally Abdominal:     Palpations: Abdomen is soft.     Tenderness: There is no abdominal tenderness.  Musculoskeletal:     Cervical back: Normal range of motion and neck supple.  Neurological:     General: No focal deficit present.     Mental Status: She is alert and oriented to person, place, and time.     {Labs  (Optional):23779}    Assessment & Plan:   Preop examination -     Comprehensive metabolic panel with GFR -     CBC with Differential/Platelet  Macromastia  Encounter for completion of form with patient   Patient examined and appears medically stable to undergo the stated procedure.   Return if symptoms worsen or fail to improve.   Tanda Raguel SQUIBB, MD

## 2024-06-18 NOTE — Telephone Encounter (Signed)
 Patient came in for appt today. Dr. Tanda signed forms. I have faxed them back to [facility]

## 2024-06-19 LAB — CBC WITH DIFFERENTIAL/PLATELET
Basophils Absolute: 0 x10E3/uL (ref 0.0–0.2)
Basos: 0 %
EOS (ABSOLUTE): 0.1 x10E3/uL (ref 0.0–0.4)
Eos: 1 %
Hematocrit: 35.8 % (ref 34.0–46.6)
Hemoglobin: 11.5 g/dL (ref 11.1–15.9)
Immature Grans (Abs): 0 x10E3/uL (ref 0.0–0.1)
Immature Granulocytes: 0 %
Lymphocytes Absolute: 3.2 x10E3/uL — ABNORMAL HIGH (ref 0.7–3.1)
Lymphs: 48 %
MCH: 29.5 pg (ref 26.6–33.0)
MCHC: 32.1 g/dL (ref 31.5–35.7)
MCV: 92 fL (ref 79–97)
Monocytes Absolute: 0.5 x10E3/uL (ref 0.1–0.9)
Monocytes: 7 %
Neutrophils Absolute: 2.9 x10E3/uL (ref 1.4–7.0)
Neutrophils: 44 %
Platelets: 320 x10E3/uL (ref 150–450)
RBC: 3.9 x10E6/uL (ref 3.77–5.28)
RDW: 13.2 % (ref 11.7–15.4)
WBC: 6.7 x10E3/uL (ref 3.4–10.8)

## 2024-06-19 LAB — COMPREHENSIVE METABOLIC PANEL WITH GFR
ALT: 13 IU/L (ref 0–32)
AST: 18 IU/L (ref 0–40)
Albumin: 4.3 g/dL (ref 3.9–4.9)
Alkaline Phosphatase: 87 IU/L (ref 41–116)
BUN/Creatinine Ratio: 15 (ref 9–23)
BUN: 17 mg/dL (ref 6–24)
Bilirubin Total: <0.2 mg/dL (ref 0.0–1.2)
CO2: 23 mmol/L (ref 20–29)
Calcium: 9.5 mg/dL (ref 8.7–10.2)
Chloride: 98 mmol/L (ref 96–106)
Creatinine, Ser: 1.17 mg/dL — ABNORMAL HIGH (ref 0.57–1.00)
Globulin, Total: 3.1 g/dL (ref 1.5–4.5)
Glucose: 82 mg/dL (ref 70–99)
Potassium: 4.6 mmol/L (ref 3.5–5.2)
Sodium: 137 mmol/L (ref 134–144)
Total Protein: 7.4 g/dL (ref 6.0–8.5)
eGFR: 59 mL/min/1.73 — ABNORMAL LOW (ref >59)

## 2024-06-27 ENCOUNTER — Other Ambulatory Visit: Payer: Self-pay

## 2024-06-27 ENCOUNTER — Encounter (HOSPITAL_BASED_OUTPATIENT_CLINIC_OR_DEPARTMENT_OTHER): Payer: Self-pay | Admitting: Plastic Surgery

## 2024-06-28 ENCOUNTER — Ambulatory Visit: Payer: Self-pay | Admitting: Family Medicine

## 2024-07-04 ENCOUNTER — Other Ambulatory Visit: Payer: Self-pay

## 2024-07-04 ENCOUNTER — Encounter (HOSPITAL_BASED_OUTPATIENT_CLINIC_OR_DEPARTMENT_OTHER): Payer: Self-pay | Admitting: Plastic Surgery

## 2024-07-04 ENCOUNTER — Ambulatory Visit (HOSPITAL_BASED_OUTPATIENT_CLINIC_OR_DEPARTMENT_OTHER)
Admission: RE | Admit: 2024-07-04 | Discharge: 2024-07-04 | Disposition: A | Discharge status: Home / Self Care | Attending: Plastic Surgery | Admitting: Plastic Surgery

## 2024-07-04 ENCOUNTER — Encounter (HOSPITAL_BASED_OUTPATIENT_CLINIC_OR_DEPARTMENT_OTHER)
Admission: RE | Disposition: A | Discharge status: Home / Self Care | Payer: Self-pay | Source: Home / Self Care | Attending: Plastic Surgery

## 2024-07-04 ENCOUNTER — Ambulatory Visit (HOSPITAL_BASED_OUTPATIENT_CLINIC_OR_DEPARTMENT_OTHER): Admitting: Certified Registered Nurse Anesthetist

## 2024-07-04 DIAGNOSIS — Z803 Family history of malignant neoplasm of breast: Secondary | ICD-10-CM | POA: Insufficient documentation

## 2024-07-04 DIAGNOSIS — M549 Dorsalgia, unspecified: Secondary | ICD-10-CM | POA: Diagnosis not present

## 2024-07-04 DIAGNOSIS — Z01818 Encounter for other preprocedural examination: Secondary | ICD-10-CM

## 2024-07-04 DIAGNOSIS — N62 Hypertrophy of breast: Secondary | ICD-10-CM

## 2024-07-04 DIAGNOSIS — Z87891 Personal history of nicotine dependence: Secondary | ICD-10-CM | POA: Insufficient documentation

## 2024-07-04 DIAGNOSIS — N6031 Fibrosclerosis of right breast: Secondary | ICD-10-CM | POA: Insufficient documentation

## 2024-07-04 DIAGNOSIS — M542 Cervicalgia: Secondary | ICD-10-CM | POA: Diagnosis not present

## 2024-07-04 DIAGNOSIS — N6032 Fibrosclerosis of left breast: Secondary | ICD-10-CM | POA: Diagnosis not present

## 2024-07-04 DIAGNOSIS — N6489 Other specified disorders of breast: Secondary | ICD-10-CM | POA: Insufficient documentation

## 2024-07-04 HISTORY — PX: BREAST REDUCTION SURGERY: SHX8

## 2024-07-04 LAB — POCT PREGNANCY, URINE: Preg Test, Ur: NEGATIVE

## 2024-07-04 SURGERY — BREAST REDUCTION WITH LIPOSUCTION
Anesthesia: General | Site: Breast | Laterality: Bilateral

## 2024-07-04 MED ORDER — OXYCODONE HCL 5 MG PO TABS: 5.0000 mg | ORAL_TABLET | Freq: Once | ORAL | Status: DC | PRN

## 2024-07-04 MED ORDER — LIDOCAINE-EPINEPHRINE 1 %-1:100000 IJ SOLN
INTRAMUSCULAR | Status: DC | PRN
  Administered 2024-07-04: 50 mL

## 2024-07-04 MED ORDER — FENTANYL CITRATE (PF) 100 MCG/2ML IJ SOLN
INTRAMUSCULAR | Status: DC | PRN
  Administered 2024-07-04 (×3): 50 ug via INTRAVENOUS
  Administered 2024-07-04 (×2): 25 ug via INTRAVENOUS

## 2024-07-04 MED ORDER — FENTANYL CITRATE (PF) 100 MCG/2ML IJ SOLN
INTRAMUSCULAR | Status: AC
  Filled 2024-07-04: qty 2

## 2024-07-04 MED ORDER — BUPIVACAINE LIPOSOME 1.3 % IJ SUSP
INTRAMUSCULAR | Status: DC | PRN
  Administered 2024-07-04: 40 mL

## 2024-07-04 MED ORDER — LIDOCAINE HCL (PF) 1 % IJ SOLN
INTRAMUSCULAR | Status: AC
  Filled 2024-07-04: qty 120

## 2024-07-04 MED ORDER — BUPIVACAINE HCL (PF) 0.25 % IJ SOLN
INTRAMUSCULAR | Status: AC
  Filled 2024-07-04: qty 120

## 2024-07-04 MED ORDER — LIDOCAINE-EPINEPHRINE 1 %-1:100000 IJ SOLN
INTRAMUSCULAR | Status: AC
  Filled 2024-07-04: qty 2

## 2024-07-04 MED ORDER — VASHE WOUND IRRIGATION OPTIME
TOPICAL | Status: DC | PRN
  Administered 2024-07-04: 34 [oz_av]

## 2024-07-04 MED ORDER — LACTATED RINGERS IV SOLN: INTRAVENOUS | Status: DC

## 2024-07-04 MED ORDER — HYDROMORPHONE HCL 1 MG/ML IJ SOLN
INTRAMUSCULAR | Status: AC
  Filled 2024-07-04: qty 0.5

## 2024-07-04 MED ORDER — PHENYLEPHRINE HCL-NACL 20-0.9 MG/250ML-% IV SOLN
INTRAVENOUS | Status: DC | PRN
  Administered 2024-07-04: 30 ug/min via INTRAVENOUS

## 2024-07-04 MED ORDER — NITROGLYCERIN 2 % TD OINT
TOPICAL_OINTMENT | TRANSDERMAL | Status: AC
  Filled 2024-07-04: qty 30

## 2024-07-04 MED ORDER — ONDANSETRON HCL 4 MG/2ML IJ SOLN
INTRAMUSCULAR | Status: AC
  Filled 2024-07-04: qty 2

## 2024-07-04 MED ORDER — PROPOFOL 500 MG/50ML IV EMUL
INTRAVENOUS | Status: AC
  Filled 2024-07-04: qty 100

## 2024-07-04 MED ORDER — NITROGLYCERIN 2 % TD OINT
TOPICAL_OINTMENT | TRANSDERMAL | Status: DC | PRN
  Administered 2024-07-04: 1 [in_us] via TOPICAL

## 2024-07-04 MED ORDER — TRANEXAMIC ACID-NACL 1000-0.7 MG/100ML-% IV SOLN
INTRAVENOUS | Status: DC | PRN
  Administered 2024-07-04: 1000 mg via INTRAVENOUS

## 2024-07-04 MED ORDER — MIDAZOLAM HCL 2 MG/2ML IJ SOLN
INTRAMUSCULAR | Status: AC
  Filled 2024-07-04: qty 2

## 2024-07-04 MED ORDER — HYDROMORPHONE HCL 1 MG/ML IJ SOLN
INTRAMUSCULAR | Status: DC | PRN
  Administered 2024-07-04: .5 mg via INTRAVENOUS

## 2024-07-04 MED ORDER — SODIUM CHLORIDE 0.9 % IV SOLN: 250.0000 mL | INTRAVENOUS | Status: DC | PRN

## 2024-07-04 MED ORDER — LIDOCAINE HCL 1 % IJ SOLN
INTRAVENOUS | Status: DC | PRN
  Administered 2024-07-04: 350 mL

## 2024-07-04 MED ORDER — PHENYLEPHRINE HCL (PRESSORS) 10 MG/ML IV SOLN
INTRAVENOUS | Status: DC | PRN
  Administered 2024-07-04 (×2): 80 ug via INTRAVENOUS
  Administered 2024-07-04 (×2): 160 ug via INTRAVENOUS
  Administered 2024-07-04: 80 ug via INTRAVENOUS

## 2024-07-04 MED ORDER — DEXAMETHASONE SODIUM PHOSPHATE 4 MG/ML IJ SOLN
INTRAMUSCULAR | Status: DC | PRN
  Administered 2024-07-04: 8 mg via INTRAVENOUS

## 2024-07-04 MED ORDER — AMISULPRIDE (ANTIEMETIC) 5 MG/2ML IV SOLN: 10.0000 mg | Freq: Once | INTRAVENOUS | Status: DC | PRN

## 2024-07-04 MED ORDER — SODIUM CHLORIDE 0.9 % IV SOLN: 12.5000 mg | INTRAVENOUS | Status: DC | PRN

## 2024-07-04 MED ORDER — OXYCODONE HCL 5 MG PO TABS: 5.0000 mg | ORAL_TABLET | ORAL | Status: DC | PRN

## 2024-07-04 MED ORDER — EPINEPHRINE PF 1 MG/ML IJ SOLN
INTRAMUSCULAR | Status: AC
  Filled 2024-07-04: qty 2

## 2024-07-04 MED ORDER — ACETAMINOPHEN 325 MG PO TABS
ORAL_TABLET | ORAL | Status: AC
  Filled 2024-07-04: qty 1

## 2024-07-04 MED ORDER — PROPOFOL 10 MG/ML IV BOLUS
INTRAVENOUS | Status: DC | PRN
  Administered 2024-07-04: 200 mg via INTRAVENOUS

## 2024-07-04 MED ORDER — MIDAZOLAM HCL 5 MG/5ML IJ SOLN: INTRAMUSCULAR | Status: DC | PRN

## 2024-07-04 MED ORDER — CEFAZOLIN SODIUM-DEXTROSE 2-4 GM/100ML-% IV SOLN
2.0000 g | INTRAVENOUS | Status: AC
  Administered 2024-07-04: 2 g via INTRAVENOUS

## 2024-07-04 MED ORDER — SODIUM CHLORIDE 0.9% FLUSH: 3.0000 mL | INTRAVENOUS | Status: DC | PRN

## 2024-07-04 MED ORDER — SODIUM CHLORIDE 0.9% FLUSH: 3.0000 mL | Freq: Two times a day (BID) | INTRAVENOUS | Status: DC

## 2024-07-04 MED ORDER — MIDAZOLAM HCL 5 MG/5ML IJ SOLN
INTRAMUSCULAR | Status: DC | PRN
  Administered 2024-07-04: 2 mg via INTRAVENOUS

## 2024-07-04 MED ORDER — ACETAMINOPHEN 325 MG PO TABS
650.0000 mg | ORAL_TABLET | ORAL | Status: DC | PRN
  Administered 2024-07-04: 650 mg via ORAL

## 2024-07-04 MED ORDER — PROPOFOL 10 MG/ML IV BOLUS
INTRAVENOUS | Status: AC
  Filled 2024-07-04: qty 20

## 2024-07-04 MED ORDER — PROPOFOL 500 MG/50ML IV EMUL
INTRAVENOUS | Status: DC | PRN
  Administered 2024-07-04: 25 ug/kg/min via INTRAVENOUS

## 2024-07-04 MED ORDER — OXYCODONE HCL 5 MG/5ML PO SOLN: 5.0000 mg | Freq: Once | ORAL | Status: DC | PRN

## 2024-07-04 MED ORDER — FENTANYL CITRATE (PF) 100 MCG/2ML IJ SOLN: 25.0000 ug | INTRAMUSCULAR | Status: DC | PRN

## 2024-07-04 MED ORDER — IBUPROFEN 200 MG PO TABS
ORAL_TABLET | ORAL | Status: AC
  Filled 2024-07-04: qty 1

## 2024-07-04 MED ORDER — GLYCOPYRROLATE PF 0.2 MG/ML IJ SOSY
PREFILLED_SYRINGE | INTRAMUSCULAR | Status: AC
  Filled 2024-07-04: qty 1

## 2024-07-04 MED ORDER — DEXAMETHASONE SODIUM PHOSPHATE 10 MG/ML IJ SOLN
INTRAMUSCULAR | Status: AC
  Filled 2024-07-04: qty 1

## 2024-07-04 MED ORDER — HYDROMORPHONE HCL 1 MG/ML IJ SOLN: 0.2500 mg | INTRAMUSCULAR | Status: DC | PRN

## 2024-07-04 MED ORDER — LIDOCAINE 2% (20 MG/ML) 5 ML SYRINGE
INTRAMUSCULAR | Status: AC
  Filled 2024-07-04: qty 5

## 2024-07-04 MED ORDER — BUPIVACAINE LIPOSOME 1.3 % IJ SUSP
INTRAMUSCULAR | Status: AC
  Filled 2024-07-04: qty 20

## 2024-07-04 MED ORDER — CHLORHEXIDINE GLUCONATE CLOTH 2 % EX PADS: 6.0000 | MEDICATED_PAD | Freq: Once | CUTANEOUS | Status: DC

## 2024-07-04 MED ORDER — GLYCOPYRROLATE 0.2 MG/ML IJ SOLN
INTRAMUSCULAR | Status: DC | PRN
  Administered 2024-07-04: .2 mg via INTRAVENOUS

## 2024-07-04 MED ORDER — MEPERIDINE HCL 25 MG/ML IJ SOLN: 6.2500 mg | INTRAMUSCULAR | Status: DC | PRN

## 2024-07-04 MED ORDER — ONDANSETRON HCL 4 MG/2ML IJ SOLN
INTRAMUSCULAR | Status: DC | PRN
  Administered 2024-07-04: 4 mg via INTRAVENOUS

## 2024-07-04 MED ORDER — ACETAMINOPHEN 325 MG RE SUPP: 650.0000 mg | RECTAL | Status: DC | PRN

## 2024-07-04 MED ORDER — LIDOCAINE HCL (CARDIAC) PF 100 MG/5ML IV SOSY
PREFILLED_SYRINGE | INTRAVENOUS | Status: DC | PRN
  Administered 2024-07-04: 50 mg via INTRAVENOUS

## 2024-07-04 SURGICAL SUPPLY — 67 items
BINDER BREAST LRG (GAUZE/BANDAGES/DRESSINGS) IMPLANT
BINDER BREAST MEDIUM (GAUZE/BANDAGES/DRESSINGS) IMPLANT
BINDER BREAST XLRG (GAUZE/BANDAGES/DRESSINGS) IMPLANT
BINDER BREAST XXLRG (GAUZE/BANDAGES/DRESSINGS) IMPLANT
BIOPATCH RED 1 DISK 7.0 (GAUZE/BANDAGES/DRESSINGS) IMPLANT
BLADE HEX COATED 2.75 (ELECTRODE) IMPLANT
BLADE KNIFE PERSONA 10 (BLADE) ×2 IMPLANT
BLADE SURG 15 STRL LF DISP TIS (BLADE) ×1 IMPLANT
CANISTER SUCT 1200ML W/VALVE (MISCELLANEOUS) ×1 IMPLANT
CLEANSER WND VASHE 34 (WOUND CARE) ×1 IMPLANT
COTTONBALL LRG STERILE PKG (GAUZE/BANDAGES/DRESSINGS) IMPLANT
COVER BACK TABLE 60X90IN (DRAPES) ×1 IMPLANT
COVER MAYO STAND STRL (DRAPES) ×1 IMPLANT
DERMABOND ADVANCED .7 DNX12 (GAUZE/BANDAGES/DRESSINGS) ×2 IMPLANT
DRAIN CHANNEL 15F RND FF W/TCR (WOUND CARE) IMPLANT
DRAIN CHANNEL 19F RND (DRAIN) IMPLANT
DRAPE LAPAROSCOPIC ABDOMINAL (DRAPES) ×1 IMPLANT
DRAPE UTILITY XL STRL (DRAPES) ×1 IMPLANT
DRSG MEPILEX POST OP 4X8 (GAUZE/BANDAGES/DRESSINGS) ×2 IMPLANT
DRSG TEGADERM 4X4.75 (GAUZE/BANDAGES/DRESSINGS) IMPLANT
DRSG TELFA 3X8 NADH STRL (GAUZE/BANDAGES/DRESSINGS) IMPLANT
ELECTRODE BLDE 4.0 EZ CLN MEGD (MISCELLANEOUS) ×1 IMPLANT
ELECTRODE REM PT RTRN 9FT ADLT (ELECTROSURGICAL) ×1 IMPLANT
EVACUATOR SILICONE 100CC (DRAIN) IMPLANT
GAUZE PAD ABD 8X10 STRL (GAUZE/BANDAGES/DRESSINGS) ×2 IMPLANT
GAUZE XEROFORM 5X9 LF (GAUZE/BANDAGES/DRESSINGS) IMPLANT
GLOVE BIO SURGEON STRL SZ 6.5 (GLOVE) ×2 IMPLANT
GLOVE BIO SURGEON STRL SZ7.5 (GLOVE) ×1 IMPLANT
GLOVE BIOGEL PI IND STRL 7.0 (GLOVE) IMPLANT
GLOVE BIOGEL PI IND STRL 8 (GLOVE) IMPLANT
GOWN STRL REUS W/ TWL LRG LVL3 (GOWN DISPOSABLE) ×2 IMPLANT
GOWN STRL REUS W/ TWL XL LVL3 (GOWN DISPOSABLE) IMPLANT
LINER CANISTER 1000CC FLEX (MISCELLANEOUS) ×1 IMPLANT
MARKER SKIN DUAL TIP RULER LAB (MISCELLANEOUS) IMPLANT
NDL FILTER BLUNT 18X1 1/2 (NEEDLE) IMPLANT
NDL HYPO 25X1 1.5 SAFETY (NEEDLE) ×2 IMPLANT
NEEDLE FILTER BLUNT 18X1 1/2 (NEEDLE) ×1 IMPLANT
NEEDLE HYPO 25X1 1.5 SAFETY (NEEDLE) ×2 IMPLANT
NS IRRIG 1000ML POUR BTL (IV SOLUTION) IMPLANT
PACK BASIN DAY SURGERY FS (CUSTOM PROCEDURE TRAY) ×1 IMPLANT
PAD ALCOHOL SWAB (MISCELLANEOUS) IMPLANT
PAD FOAM SILICONE BACKED (GAUZE/BANDAGES/DRESSINGS) IMPLANT
PENCIL SMOKE EVACUATOR (MISCELLANEOUS) ×1 IMPLANT
PIN SAFETY STERILE (MISCELLANEOUS) IMPLANT
POWDER MYRIAD MORCLLS FINE 500 (Miscellaneous) IMPLANT
SLEEVE SCD COMPRESS KNEE MED (STOCKING) ×1 IMPLANT
SOL PREP POV-IOD 4OZ 10% (MISCELLANEOUS) IMPLANT
SPIKE FLUID TRANSFER (MISCELLANEOUS) IMPLANT
SPONGE T-LAP 18X18 ~~LOC~~+RFID (SPONGE) ×2 IMPLANT
STRIP SUTURE WOUND CLOSURE 1/2 (MISCELLANEOUS) ×4 IMPLANT
SUT MNCRL AB 4-0 PS2 18 (SUTURE) ×4 IMPLANT
SUT MON AB 3-0 SH27 (SUTURE) ×4 IMPLANT
SUT MON AB 5-0 PS2 18 (SUTURE) IMPLANT
SUT PDS II 3-0 CT2 27 ABS (SUTURE) ×4 IMPLANT
SUT SILK 3 0 PS 1 (SUTURE) IMPLANT
SUT VIC AB 4-0 PS2 27 (SUTURE) IMPLANT
SYR 50ML LL SCALE MARK (SYRINGE) IMPLANT
SYR BULB IRRIG 60ML STRL (SYRINGE) ×1 IMPLANT
SYR CONTROL 10ML LL (SYRINGE) ×2 IMPLANT
TAPE MEASURE VINYL STERILE (MISCELLANEOUS) IMPLANT
TOWEL GREEN STERILE FF (TOWEL DISPOSABLE) ×3 IMPLANT
TRAY DSU PREP LF (CUSTOM PROCEDURE TRAY) ×1 IMPLANT
TUBE CONNECTING 20X1/4 (TUBING) ×1 IMPLANT
TUBING INFILTRATION IT-10001 (TUBING) IMPLANT
TUBING SET GRADUATE ASPIR 12FT (MISCELLANEOUS) IMPLANT
UNDERPAD 30X36 HEAVY ABSORB (UNDERPADS AND DIAPERS) ×2 IMPLANT
YANKAUER SUCT BULB TIP NO VENT (SUCTIONS) ×1 IMPLANT

## 2024-07-04 NOTE — Op Note (Signed)
 Breast Reduction Op note:    DATE OF PROCEDURE: 07/04/2024  LOCATION: [facility]  SURGEON: Estefana Fritter, DO  ASSISTANT: Estefana Peck, PA  PREOPERATIVE DIAGNOSIS 1. Macromastia 2. Neck Pain 3. Back Pain  POSTOPERATIVE DIAGNOSIS 1. Macromastia 2. Neck Pain 3. Back Pain  PROCEDURES 1. Bilateral breast reduction.  Right reduction 1035 g, Left reduction 1130 g  COMPLICATIONS: None.  DRAINS: none  INDICATIONS FOR PROCEDURE Kathleen Levy is a 43 y.o. year-old female born on 1980/12/16,with a history of symptomatic macromastia with concomitant back pain, neck pain, shoulder grooving from her bra.   MRN: [id number]  CONSENT Informed consent was obtained directly from the patient. The risks, benefits and alternatives were fully discussed. Specific risks including but not limited to bleeding, infection, hematoma, seroma, scarring, pain, nipple necrosis, asymmetry, poor cosmetic results, and need for further surgery were discussed. The patient's questions were answered.  DESCRIPTION OF PROCEDURE  Patient was brought into the operating room and rested on the operating room table in the supine position.  SCDs were placed and appropriate padding was performed.  Antibiotics were given. The patient underwent general anesthesia and the chest was prepped and draped in a sterile fashion.  A timeout was performed and all information was confirmed to be correct by those in the room. Tumescent was placed in the lateral breasts on each side.  Liposuction was done laterally on each breast.  Right side: Preoperative markings were confirmed.  Incision lines were injected with local containing epinephrine .  After waiting for vasoconstriction, the marked lines were incised with a #15 blade.  A Wise-pattern superomedial breast reduction was performed by de-epithelializing the pedicle, using bovie to create the superomedial pedicle, and removing breast tissue from the superior,  lateral, and inferior portions of the breast. Myriad and Experel were placed in the pocket.  Care was taken to not undermine the breast pedicle. Hemostasis was achieved.  The nipple was gently rotated into position and the soft tissue closed with 4-0 Monocryl.   The pocket was irrigated and hemostasis confirmed.  The deep tissues were approximated with 3-0 PDS sutures.  The skin was closed with deep dermal 3-0 Monocryl and subcuticular 4-0 Monocryl sutures.  The nipple and skin flaps had good capillary refill at the end of the procedure.    Left side: Preoperative markings were confirmed.  Incision lines were injected with local containing epinephrine .  After waiting for vasoconstriction, the marked lines were incised with a #15 blade.  A Wise-pattern superomedial breast reduction was performed by de-epithelializing the pedicle, using bovie to create the superomedial pedicle, and removing breast tissue from the superior, lateral, and inferior portions of the breast.  Care was taken to not undermine the breast pedicle. Myriad and experel were placed in the pocket. Hemostasis was achieved.  The nipple was gently rotated into position and the soft tissue was closed with 4-0 Monocryl.  The patient was sat upright and size and shape symmetry was confirmed.  The pocket was irrigated and hemostasis confirmed.  The deep tissues were approximated with 3-0 PDS sutures. The skin was closed with deep dermal 3-0 Monocryl and subcuticular 4-0 Monocryl sutures.  Dermabond was applied.  A breast binder and ABDs were placed.  The nipple and skin flaps had good capillary refill at the end of the procedure.  The patient tolerated the procedure well. The patient was allowed to wake from anesthesia and taken to the recovery room in satisfactory condition.  The advanced practice practitioner (APP)  assisted throughout the case.  The APP was essential in retraction and counter traction when needed to make the case progress smoothly.   This retraction and assistance made it possible to see the tissue plans for the procedure.  The assistance was needed for blood control, tissue re-approximation and assisted with closure of the incision site.

## 2024-07-04 NOTE — Anesthesia Preprocedure Evaluation (Addendum)
 Anesthesia Evaluation  Patient identified by MRN, date of birth, ID band Patient awake    Reviewed: Allergy & Precautions, H&P , NPO status , Patient's Chart, lab work & pertinent test results  Airway Mallampati: II  TM Distance: >3 FB Neck ROM: Full    Dental  (+) Dental Advisory Given   Pulmonary neg pulmonary ROS, former smoker   Pulmonary exam normal breath sounds clear to auscultation       Cardiovascular negative cardio ROS Normal cardiovascular exam Rhythm:Regular Rate:Normal     Neuro/Psych   Anxiety Depression    negative neurological ROS  negative psych ROS   GI/Hepatic negative GI ROS, Neg liver ROS,,,  Endo/Other  negative endocrine ROS    Renal/GU negative Renal ROS  negative genitourinary   Musculoskeletal negative musculoskeletal ROS (+)    Abdominal  (+) + obese  Peds negative pediatric ROS (+)  Hematology negative hematology ROS (+)   Anesthesia Other Findings   Reproductive/Obstetrics negative OB ROS                              Anesthesia Physical Anesthesia Plan  ASA: 2  Anesthesia Plan: General   Post-op Pain Management:    Induction: Intravenous  PONV Risk Score and Plan: 3 and Ondansetron , Dexamethasone, Midazolam and Treatment may vary due to age or medical condition  Airway Management Planned: LMA and Oral ETT  Additional Equipment:   Intra-op Plan:   Post-operative Plan: Extubation in OR  Informed Consent: I have reviewed the patients History and Physical, chart, labs and discussed the procedure including the risks, benefits and alternatives for the proposed anesthesia with the patient or authorized representative who has indicated his/her understanding and acceptance.     Dental advisory given  Plan Discussed with: CRNA  Anesthesia Plan Comments:         Anesthesia Quick Evaluation

## 2024-07-04 NOTE — Discharge Instructions (Addendum)
 INSTRUCTIONS FOR AFTER BREAST SURGERY   You will likely have some questions about what to expect following your operation.  The following information will help you and your family understand what to expect when you are discharged from the hospital.  It is important to follow these guidelines to help ensure a smooth recovery and reduce complication.  Postoperative instructions include information on: diet, wound care, medications and physical activity.  AFTER SURGERY Expect to go home after the procedure.  In some cases, you may need to spend one night in the hospital for observation.  DIET Breast surgery does not require a specific diet.  However, the healthier you eat the better your body will heal. It is important to increasing your protein intake.  This means limiting the foods with sugar and carbohydrates.  Focus on vegetables and some meat.  If you have liposuction during your procedure be sure to drink water .  If your urine is bright yellow, then it is concentrated, and you need to drink more water .  As a general rule after surgery, you should have 8 ounces of water  every hour while awake.  If you find you are persistently nauseated or unable to take in liquids let us  know.  NO TOBACCO USE or EXPOSURE.  This will slow your healing process and lead to a wound.  WOUND CARE Leave the binder on for 3 days . Use fragrance free soap like Dial, Dove or [place].   After 3 days you can remove the binder to shower. Once dry apply binder or sports bra. If you have liposuction you will have a soft and spongy dressing (Lipofoam) that helps prevent creases in your skin.  Remove before you shower and then replace it.  It is also available on [employer]. If you have steri-strips / tape directly attached to your skin leave them in place. It is OK to get these wet.   No baths, pools or hot tubs for four weeks. We close your incision to leave the smallest and best-looking scar. No ointment or creams on your incisions  for four weeks.  No Neosporin (Too many skin reactions).  A few weeks after surgery you can use Mederma and start massaging the scar. We ask you to wear your binder or sports bra for the first 6 weeks around the clock, including while sleeping. This provides added comfort and helps reduce the fluid accumulation at the surgery site. NO Ice or heating pads to the operative site.  You have a very high risk of a BURN before you feel the temperature change.  ACTIVITY No heavy lifting until cleared by the doctor.  This usually means no more than a half-gallon of milk.  It is OK to walk and climb stairs. Moving your legs is very important to decrease your risk of a blood clot.  It will also help keep you from getting deconditioned.  Every 1 to 2 hours get up and walk for 5 minutes. This will help with a quicker recovery back to normal.  Let pain be your guide so you don't do too much.  This time is for you to recover.  You will be more comfortable if you sleep and rest with your head elevated either with a few pillows under you or in a recliner.  No stomach sleeping for a three months.  WORK Everyone returns to work at different times. As a rough guide, most people take at least 1 - 2 weeks off prior to returning to work. If  you need documentation for your job, give the forms to the front staff at the clinic.  DRIVING Arrange for someone to bring you home from the hospital after your surgery.  You may be able to drive a few days after surgery but not while taking any narcotics or valium .  BOWEL MOVEMENTS Constipation can occur after anesthesia and while taking pain medication.  It is important to stay ahead for your comfort.  We recommend taking Milk of Magnesia (2 tablespoons; twice a day) while taking the pain pills.  MEDICATIONS You may be prescribed should start after surgery At your preoperative visit for you history and physical you were given the following medications: Antibiotic: Start this  medication when you get home and take according to the instructions on the bottle. Zofran  4 mg:  This is to treat nausea and vomiting.  You can take this every 6 hours as needed and only if needed. Oxycodone  5 mg every 6 hours for 3 - 5 days.  This is to be used after you have taken the Motrin  or the Tylenol . 4.   Gabapentin  300 mg every 12 hours for 7 days.  Over the counter Medication to take: Ibuprofen  (Motrin ) 400 - 600 mg every 6 hour for 7 days Tylenol  500 mg every 6 hours for 7 days.  Only take the Oxycodone  after you have tried these two. MiraLAX or stool softener of choice: Take this according to the bottle if you take the Norco.  If muscle work done:  Flexeril 5 mg every 12 hours for 7 days.  WHEN TO CALL Call your surgeon's office if any of the following occur: Fever 101 degrees F or greater Excessive bleeding or fluid from the incision site. Pain that increases over time without aid from the medications Redness, warmth, or pus draining from incision sites Persistent nausea or inability to take in liquids Severe misshapen area that underwent the operation. Post Anesthesia Home Care Instructions  Activity: Get plenty of rest for the remainder of the day. A responsible individual must stay with you for 24 hours following the procedure.  For the next 24 hours, DO NOT: -Drive a car -Advertising copywriter -Drink alcoholic beverages -Take any medication unless instructed by your physician -Make any legal decisions or sign important papers.  Meals: Start with liquid foods such as gelatin or soup. Progress to regular foods as tolerated. Avoid greasy, spicy, heavy foods. If nausea and/or vomiting occur, drink only clear liquids until the nausea and/or vomiting subsides. Call your physician if vomiting continues.  Special Instructions/Symptoms: Your throat may feel dry or sore from the anesthesia or the breathing tube placed in your throat during surgery. If this causes discomfort,  gargle with warm salt water . The discomfort should disappear within 24 hours.  If you had a scopolamine  patch placed behind your ear for the management of post- operative nausea and/or vomiting:  1. The medication in the patch is effective for 72 hours, after which it should be removed.  Wrap patch in a tissue and discard in the trash. Wash hands thoroughly with soap and water . 2. You may remove the patch earlier than 72 hours if you experience unpleasant side effects which may include dry mouth, dizziness or visual disturbances. 3. Avoid touching the patch. Wash your hands with soap and water  after contact with the patch.     Information for Discharge Teaching: EXPAREL  (bupivacaine  liposome injectable suspension)   Pain relief is important to your recovery. The goal is to control your  pain so you can move easier and return to your normal activities as soon as possible after your procedure. Your physician may use several types of medicines to manage pain, swelling, and more.  Your surgeon or anesthesiologist gave you EXPAREL (bupivacaine ) to help control your pain after surgery.  EXPAREL  is a local anesthetic designed to release slowly over an extended period of time to provide pain relief by numbing the tissue around the surgical site. EXPAREL  is designed to release pain medication over time and can control pain for up to 72 hours. Depending on how you respond to EXPAREL , you may require less pain medication during your recovery. EXPAREL  can help reduce or eliminate the need for opioids during the first few days after surgery when pain relief is needed the most. EXPAREL  is not an opioid and is not addictive. It does not cause sleepiness or sedation.   Important! A teal colored band has been placed on your arm with the date, time and amount of EXPAREL  you have received. Please leave this armband in place for the full 96 hours following administration, and then you may remove the band. If you  return to the hospital for any reason within 96 hours following the administration of EXPAREL , the armband provides important information that your health care providers to know, and alerts them that you have received this anesthetic.    Possible side effects of EXPAREL : Temporary loss of sensation or ability to move in the area where medication was injected. Nausea, vomiting, constipation Rarely, numbness and tingling in your mouth or lips, lightheadedness, or anxiety may occur. Call your doctor right away if you think you may be experiencing any of these sensations, or if you have other questions regarding possible side effects.  Follow all other discharge instructions given to you by your surgeon or nurse. Eat a healthy diet and drink plenty of water  or other fluids.

## 2024-07-04 NOTE — Interval H&P Note (Signed)
 History and Physical Interval Note:  07/04/2024 7:00 AM  Kathleen Levy  has presented today for surgery, with the diagnosis of Hypertrophy of breast.  The various methods of treatment have been discussed with the patient and family. After consideration of risks, benefits and other options for treatment, the patient has consented to  Procedure(s): BREAST REDUCTION WITH LIPOSUCTION (Bilateral) as a surgical intervention.  The patient's history has been reviewed, patient examined, no change in status, stable for surgery.  I have reviewed the patient's chart and labs.  Questions were answered to the patient's satisfaction.     Estefana RAMAN Kendre Sires

## 2024-07-04 NOTE — Anesthesia Postprocedure Evaluation (Signed)
 Anesthesia Post Note  Patient: Kathleen Levy  Procedure(s) Performed: BREAST REDUCTION WITH LIPOSUCTION (Bilateral: Breast)     Patient location during evaluation: PACU Anesthesia Type: General Level of consciousness: awake and alert Pain management: pain level controlled Vital Signs Assessment: post-procedure vital signs reviewed and stable Respiratory status: spontaneous breathing, nonlabored ventilation and respiratory function stable Cardiovascular status: blood pressure returned to baseline and stable Postop Assessment: no apparent nausea or vomiting Anesthetic complications: no   No notable events documented.  Last Vitals:  Vitals:   07/04/24 1045 07/04/24 1125  BP:  105/67  Pulse: 64 67  Resp: 15 16  Temp:  36.8 C  SpO2: 98% 98%    Last Pain:  Vitals:   07/04/24 1125  TempSrc:   PainSc: 5                  Butler Levander Pinal

## 2024-07-04 NOTE — Transfer of Care (Signed)
 Immediate Anesthesia Transfer of Care Note  Patient: Kathleen Levy  Procedure(s) Performed: BREAST REDUCTION WITH LIPOSUCTION (Bilateral: Breast)  Patient Location: PACU  Anesthesia Type:General  Level of Consciousness: awake, alert , and oriented  Airway & Oxygen Therapy: Patient Spontanous Breathing and Patient connected to face mask oxygen  Post-op Assessment: Report given to RN and Post -op Vital signs reviewed and stable  Post vital signs: Reviewed and stable  Last Vitals:  Vitals Value Taken Time  BP 89/57 07/04/24 10:00  Temp 36.2 C 07/04/24 09:58  Pulse 81 07/04/24 10:02  Resp 12 07/04/24 10:02  SpO2 99 % 07/04/24 10:02  Vitals shown include unfiled device data.  Last Pain:  Vitals:   07/04/24 1000  TempSrc:   PainSc: Asleep      Patients Stated Pain Goal: 3 (07/04/24 9362)  Complications: No notable events documented.

## 2024-07-04 NOTE — Anesthesia Procedure Notes (Signed)
 Procedure Name: LMA Insertion Date/Time: 07/04/2024 7:38 AM  Performed by: Buster Catheryn SAUNDERS, CRNAPre-anesthesia Checklist: Patient identified, Emergency Drugs available, Suction available and Patient being monitored Patient Re-evaluated:Patient Re-evaluated prior to induction Oxygen Delivery Method: Circle system utilized Preoxygenation: Pre-oxygenation with 100% oxygen Induction Type: IV induction Ventilation: Mask ventilation without difficulty LMA: LMA inserted LMA Size: 4.0 Number of attempts: 1 Placement Confirmation: positive ETCO2 Tube secured with: Tape Dental Injury: Teeth and Oropharynx as per pre-operative assessment

## 2024-07-05 ENCOUNTER — Encounter (HOSPITAL_BASED_OUTPATIENT_CLINIC_OR_DEPARTMENT_OTHER): Payer: Self-pay | Admitting: Plastic Surgery

## 2024-07-08 ENCOUNTER — Telehealth: Payer: Self-pay | Admitting: Plastic Surgery

## 2024-07-08 ENCOUNTER — Encounter: Payer: Self-pay | Admitting: Plastic Surgery

## 2024-07-08 LAB — SURGICAL PATHOLOGY

## 2024-07-08 NOTE — Telephone Encounter (Signed)
 I called the patient in regards to her MyChart message. I let her know that she can stop using the Nitroglycerin paste/patches. Patient conveyed understanding and will call if she has any further questions or concerns. Post-op appointment is 07/12/24.

## 2024-07-08 NOTE — Telephone Encounter (Signed)
 I called the patient in regards to her call. She had questions about the nitroglycerin paste/patches. I let her know that she can stop using the Nitroglycerin at this point. Patient conveyed understanding and will call if she has any further questions or concerns.

## 2024-07-08 NOTE — Telephone Encounter (Signed)
 Patient has questions regarding after care for surgery, please reach out and advise

## 2024-07-12 ENCOUNTER — Encounter: Payer: Self-pay | Admitting: Plastic Surgery

## 2024-07-12 ENCOUNTER — Ambulatory Visit: Admitting: Plastic Surgery

## 2024-07-12 VITALS — BP 106/71 | HR 77 | Ht 63.5 in | Wt 198.8 lb

## 2024-07-12 DIAGNOSIS — N62 Hypertrophy of breast: Secondary | ICD-10-CM

## 2024-07-12 NOTE — Progress Notes (Signed)
 The patient is a 43 year old female here for follow-up after undergoing bilateral breast reduction last week.  She is doing extremely well.  She looks amazing.  She has a little bit of swelling and bruising as expected.  The right side is a little more tender than the left.  She is moving her bowels without difficulty.  She is using TOPA foam and the the binder.  She can go into the sports bra and continue with the TOPA foam.  Pictures were obtained of the patient and placed in the chart with the patient's or guardian's permission.

## 2024-07-25 NOTE — Progress Notes (Signed)
 Patient is a 43 year old female who recently underwent bilateral breast reduction with Dr. Lowery on 07/04/2024.  Patient is about 3 weeks postop.  She presents to the clinic today for postoperative follow-up.  Patient was last seen in the clinic on 07/12/2024.  At this visit, patient was doing extremely well. Patient had a little bit of swelling and bruising as expected.  The right side was a little bit more tender than the left.  Today, patient reports she is overall doing really well.  She states that she has noticed an odor coming from both of her breasts.  She denies any fevers or chills.  She denies any drainage.  She does state that both her breasts are still little bit sore, but it is manageable.  She also states that she was using adhesive dressings right after surgery and reports that she has had trouble getting some of the residue off.  She denies any other issues or concerns at this time.  Chaperone present on exam.  On exam, patient is sitting upright in no acute distress.  Breasts are overall soft and symmetric.  Right breast slightly larger than left.  There is a little bit of firmness noted to the medial right breast consistent with scar tissue.  There is no overlying erythema, no obvious fluid collections palpated on exam.  NAC's are healthy bilaterally.  Steri-Strips are in place over the incisions.  These were removed.  Incisions overall appear to be intact and healing well.  There were 2 small superficial wounds noted to the right breast, 1 to the superior vertical limb incision and 1 to the inferior T-zone.  Several Monocryl sutures were also noted, these were cut and removed.  Patient tolerated well.  To the left inframammary incision, there was also a small area of hypopigmentation noted.  There were no signs of infection on exam.  To the skin surrounding the central breast, there is some adhesive which appear to be in the pattern of square dressings.  Discussed with the patient  that she should keep her surgical sites clean and dry.  Recommended that she apply Vaseline to her wounds, the incisions and the adhesive residue on her skin.  Recommended she avoid adhesives if she can.  Patient expressed understanding.  Recommended that she massage the area of firmness and encouraged gentle range of motion.  Discussed with her that this may help with some of the soreness.  Patient expressed understanding.  Discussed with patient to continue with her compression bra and avoid strenuous activities.  Patient to follow back up in about 2 weeks.  Instructed her to call if she has any questions or concerns about anything.  Pictures were obtained of the patient and placed in the chart with the patient's or guardian's permission.

## 2024-07-26 ENCOUNTER — Ambulatory Visit: Admitting: Student

## 2024-07-26 ENCOUNTER — Encounter: Payer: Self-pay | Admitting: Student

## 2024-07-26 VITALS — BP 107/74 | HR 83 | Ht 63.0 in | Wt 202.4 lb

## 2024-07-26 DIAGNOSIS — N62 Hypertrophy of breast: Secondary | ICD-10-CM

## 2024-07-26 DIAGNOSIS — Z9889 Other specified postprocedural states: Secondary | ICD-10-CM

## 2024-08-06 ENCOUNTER — Ambulatory Visit: Admitting: Student

## 2024-08-06 ENCOUNTER — Encounter: Payer: Self-pay | Admitting: Student

## 2024-08-06 VITALS — BP 112/75 | HR 73 | Temp 98.4°F | Ht 63.0 in | Wt 202.6 lb

## 2024-08-06 DIAGNOSIS — N62 Hypertrophy of breast: Secondary | ICD-10-CM

## 2024-08-06 DIAGNOSIS — Z9889 Other specified postprocedural states: Secondary | ICD-10-CM

## 2024-08-06 NOTE — Progress Notes (Signed)
 Patient is a 43 year old female who recently underwent bilateral breast reduction with Dr. Lowery on 07/04/2024. Patient is almost 5 weeks postop. She presents to the clinic today for postoperative follow-up.   Patient was last seen in the clinic on 07/26/2024.  At this visit, patient was overall doing well.  On exam, her breasts were overall soft and symmetric.  Right breast was slightly larger than the left.  There was some firmness noted to the medial right breast.  NAC's were healthy bilaterally.  Incisions were overall intact and healing well.  There were 2 small superficial wounds to the right breast and 1 to the superior vertical limb incision and 1 to the anterior T-zone.  To the left inframammary incision, there is an area of hypopigmentation noted.  Recommended that patient apply Vaseline to her wounds into the incisions into the adhesive residue on her skin.  Today, patient reports she is doing well.  She has no new issues, concerns or complaints today.  She does not report any fevers or chills.  Chaperone present on exam.  On exam, patient is sitting upright in no acute distress.  Her breasts are soft and symmetric.  There is no overlying erythema, no obvious fluid collection on exam.  There is still little bit of firmness noted to the right breast, appears to be softer than previous exam.  NAC's overall appear to be healthy.  There is a spot of hypopigmentation noted to the medial right NAC.  There is a small superficial wound noted to the inferior right NAC.  Incisions are otherwise intact and healing well.  There is a little bit of hypopigmentation still noted to the left inframammary incision.  There are no signs of infection on exam.  Discussed with the patient I would like her to continue with Vaseline to her incisions into the wound for another 2 weeks or so until the wound has completely healed.  We discussed that and she may transition to scar creams such as Mederma, silicone based  creams or tapes.  Patient expressed understanding.  Discussed with the patient that as of next week, she no longer has to wear compression and may start gradually increasing her activities.  Discussed with the patient to monitor the area of hypopigmentation.  Discussed with her to give it about 6 to 12 months to see if the pigment does return.  I discussed with her that if the pigment does not return and she is bothered by it, she may reach out to us , tattooing could possibly be an option for her.  Patient expressed understanding and states that she is not bothered by it at this time.  Patient to follow-up as needed.  I instructed her to call if she has any questions or concerns about anything.  Pictures were obtained of the patient and placed in the chart with the patient's or guardian's permission.
